# Patient Record
Sex: Male | Born: 1960
Health system: Southern US, Community
[De-identification: ages and names within clinical notes are randomized; demographics above are authoritative.]

## PROBLEM LIST (undated history)

## (undated) DIAGNOSIS — T4145XA Adverse effect of unspecified anesthetic, initial encounter: Secondary | ICD-10-CM

## (undated) DIAGNOSIS — Z87442 Personal history of urinary calculi: Secondary | ICD-10-CM

## (undated) DIAGNOSIS — M109 Gout, unspecified: Secondary | ICD-10-CM

## (undated) DIAGNOSIS — Z9889 Other specified postprocedural states: Secondary | ICD-10-CM

## (undated) DIAGNOSIS — K219 Gastro-esophageal reflux disease without esophagitis: Secondary | ICD-10-CM

## (undated) DIAGNOSIS — N529 Male erectile dysfunction, unspecified: Secondary | ICD-10-CM

## (undated) DIAGNOSIS — F329 Major depressive disorder, single episode, unspecified: Secondary | ICD-10-CM

## (undated) DIAGNOSIS — T8859XA Other complications of anesthesia, initial encounter: Secondary | ICD-10-CM

## (undated) DIAGNOSIS — I10 Essential (primary) hypertension: Secondary | ICD-10-CM

## (undated) DIAGNOSIS — R112 Nausea with vomiting, unspecified: Secondary | ICD-10-CM

## (undated) DIAGNOSIS — G4700 Insomnia, unspecified: Secondary | ICD-10-CM

## (undated) DIAGNOSIS — Z8489 Family history of other specified conditions: Secondary | ICD-10-CM

## (undated) DIAGNOSIS — R7303 Prediabetes: Secondary | ICD-10-CM

## (undated) DIAGNOSIS — C679 Malignant neoplasm of bladder, unspecified: Secondary | ICD-10-CM

## (undated) DIAGNOSIS — R972 Elevated prostate specific antigen [PSA]: Secondary | ICD-10-CM

## (undated) DIAGNOSIS — N400 Enlarged prostate without lower urinary tract symptoms: Secondary | ICD-10-CM

## (undated) DIAGNOSIS — F32A Depression, unspecified: Secondary | ICD-10-CM

## (undated) HISTORY — PX: VASECTOMY: SHX75

## (undated) HISTORY — PX: KNEE ARTHROSCOPY: SHX127

## (undated) HISTORY — PX: KIDNEY STONE SURGERY: SHX686

## (undated) HISTORY — PX: NASAL SINUS SURGERY: SHX719

## (undated) HISTORY — PX: COLONOSCOPY: SHX174

## (undated) HISTORY — PX: TONSILLECTOMY: SUR1361

---

## 2009-05-04 ENCOUNTER — Ambulatory Visit: Payer: Self-pay | Admitting: Unknown Physician Specialty

## 2009-05-11 ENCOUNTER — Ambulatory Visit: Payer: Self-pay | Admitting: Unknown Physician Specialty

## 2012-04-30 ENCOUNTER — Ambulatory Visit: Payer: Self-pay | Admitting: Unknown Physician Specialty

## 2012-12-16 ENCOUNTER — Ambulatory Visit: Payer: Self-pay | Admitting: Gastroenterology

## 2012-12-17 LAB — PATHOLOGY REPORT

## 2016-12-19 DIAGNOSIS — M7662 Achilles tendinitis, left leg: Secondary | ICD-10-CM | POA: Diagnosis not present

## 2017-01-08 DIAGNOSIS — I10 Essential (primary) hypertension: Secondary | ICD-10-CM | POA: Diagnosis not present

## 2017-01-08 DIAGNOSIS — Z1159 Encounter for screening for other viral diseases: Secondary | ICD-10-CM | POA: Diagnosis not present

## 2017-01-08 DIAGNOSIS — Z0000 Encounter for general adult medical examination without abnormal findings: Secondary | ICD-10-CM | POA: Diagnosis not present

## 2017-01-08 DIAGNOSIS — K219 Gastro-esophageal reflux disease without esophagitis: Secondary | ICD-10-CM | POA: Diagnosis not present

## 2017-04-13 DIAGNOSIS — R972 Elevated prostate specific antigen [PSA]: Secondary | ICD-10-CM | POA: Diagnosis not present

## 2017-05-31 DIAGNOSIS — L509 Urticaria, unspecified: Secondary | ICD-10-CM | POA: Diagnosis not present

## 2017-05-31 DIAGNOSIS — Z23 Encounter for immunization: Secondary | ICD-10-CM | POA: Diagnosis not present

## 2017-05-31 DIAGNOSIS — M109 Gout, unspecified: Secondary | ICD-10-CM | POA: Diagnosis not present

## 2017-05-31 DIAGNOSIS — L299 Pruritus, unspecified: Secondary | ICD-10-CM | POA: Diagnosis not present

## 2017-08-03 DIAGNOSIS — J029 Acute pharyngitis, unspecified: Secondary | ICD-10-CM | POA: Diagnosis not present

## 2017-08-06 ENCOUNTER — Other Ambulatory Visit: Payer: Self-pay | Admitting: Gastroenterology

## 2017-08-06 DIAGNOSIS — Z86010 Personal history of colonic polyps: Secondary | ICD-10-CM | POA: Diagnosis not present

## 2017-08-06 DIAGNOSIS — R945 Abnormal results of liver function studies: Principal | ICD-10-CM

## 2017-08-06 DIAGNOSIS — R7989 Other specified abnormal findings of blood chemistry: Secondary | ICD-10-CM

## 2017-08-13 ENCOUNTER — Ambulatory Visit
Admission: RE | Admit: 2017-08-13 | Discharge: 2017-08-13 | Disposition: A | Discharge status: Home / Self Care | Payer: 59 | Source: Ambulatory Visit | Attending: Gastroenterology | Admitting: Gastroenterology

## 2017-08-13 DIAGNOSIS — R7989 Other specified abnormal findings of blood chemistry: Secondary | ICD-10-CM | POA: Insufficient documentation

## 2017-08-13 DIAGNOSIS — R945 Abnormal results of liver function studies: Secondary | ICD-10-CM | POA: Diagnosis present

## 2017-08-13 DIAGNOSIS — K760 Fatty (change of) liver, not elsewhere classified: Secondary | ICD-10-CM | POA: Diagnosis not present

## 2017-08-13 DIAGNOSIS — N2889 Other specified disorders of kidney and ureter: Secondary | ICD-10-CM | POA: Insufficient documentation

## 2017-08-24 ENCOUNTER — Other Ambulatory Visit: Payer: Self-pay | Admitting: Gastroenterology

## 2017-08-24 DIAGNOSIS — N289 Disorder of kidney and ureter, unspecified: Secondary | ICD-10-CM

## 2017-08-30 ENCOUNTER — Other Ambulatory Visit
Admission: RE | Admit: 2017-08-30 | Discharge: 2017-08-30 | Disposition: A | Discharge status: Home / Self Care | Payer: 59 | Source: Ambulatory Visit | Attending: Gastroenterology | Admitting: Gastroenterology

## 2017-08-30 ENCOUNTER — Ambulatory Visit
Admission: RE | Admit: 2017-08-30 | Discharge: 2017-08-30 | Disposition: A | Discharge status: Home / Self Care | Payer: 59 | Source: Ambulatory Visit | Attending: Gastroenterology | Admitting: Gastroenterology

## 2017-08-30 DIAGNOSIS — R161 Splenomegaly, not elsewhere classified: Secondary | ICD-10-CM | POA: Diagnosis not present

## 2017-08-30 DIAGNOSIS — N289 Disorder of kidney and ureter, unspecified: Secondary | ICD-10-CM | POA: Insufficient documentation

## 2017-08-30 DIAGNOSIS — K760 Fatty (change of) liver, not elsewhere classified: Secondary | ICD-10-CM | POA: Diagnosis not present

## 2017-08-30 DIAGNOSIS — N281 Cyst of kidney, acquired: Secondary | ICD-10-CM | POA: Insufficient documentation

## 2017-08-30 LAB — BASIC METABOLIC PANEL
Anion gap: 10 (ref 5–15)
BUN: 20 mg/dL (ref 6–20)
CALCIUM: 9.4 mg/dL (ref 8.9–10.3)
CO2: 28 mmol/L (ref 22–32)
CREATININE: 1.27 mg/dL — AB (ref 0.61–1.24)
Chloride: 99 mmol/L — ABNORMAL LOW (ref 101–111)
GFR calc Af Amer: >60 mL/min (ref >60)
GFR calc non Af Amer: >60 mL/min (ref >60)
Glucose, Bld: 107 mg/dL — ABNORMAL HIGH (ref 65–99)
Potassium: 4.3 mmol/L (ref 3.5–5.1)
Sodium: 137 mmol/L (ref 135–145)

## 2017-08-30 MED ORDER — GADOBENATE DIMEGLUMINE 529 MG/ML IV SOLN
20.0000 mL | Freq: Once | INTRAVENOUS | Status: AC | PRN
  Administered 2017-08-30: 20 mL via INTRAVENOUS

## 2017-09-12 DIAGNOSIS — R945 Abnormal results of liver function studies: Secondary | ICD-10-CM | POA: Diagnosis not present

## 2017-10-11 ENCOUNTER — Encounter: Payer: Self-pay | Admitting: *Deleted

## 2017-10-12 ENCOUNTER — Ambulatory Visit
Admission: RE | Admit: 2017-10-12 | Discharge: 2017-10-12 | Disposition: A | Discharge status: Home / Self Care | Payer: 59 | Source: Ambulatory Visit | Attending: Gastroenterology | Admitting: Gastroenterology

## 2017-10-12 ENCOUNTER — Other Ambulatory Visit: Payer: Self-pay

## 2017-10-12 ENCOUNTER — Ambulatory Visit: Payer: 59 | Admitting: Certified Registered Nurse Anesthetist

## 2017-10-12 ENCOUNTER — Encounter: Payer: Self-pay | Admitting: Certified Registered Nurse Anesthetist

## 2017-10-12 ENCOUNTER — Encounter
Admission: RE | Disposition: A | Discharge status: Home / Self Care | Payer: Self-pay | Source: Ambulatory Visit | Attending: Gastroenterology

## 2017-10-12 DIAGNOSIS — D123 Benign neoplasm of transverse colon: Secondary | ICD-10-CM | POA: Diagnosis not present

## 2017-10-12 DIAGNOSIS — I10 Essential (primary) hypertension: Secondary | ICD-10-CM | POA: Insufficient documentation

## 2017-10-12 DIAGNOSIS — D124 Benign neoplasm of descending colon: Secondary | ICD-10-CM | POA: Diagnosis not present

## 2017-10-12 DIAGNOSIS — Z86010 Personal history of colonic polyps: Secondary | ICD-10-CM | POA: Diagnosis not present

## 2017-10-12 DIAGNOSIS — N529 Male erectile dysfunction, unspecified: Secondary | ICD-10-CM | POA: Insufficient documentation

## 2017-10-12 DIAGNOSIS — K5790 Diverticulosis of intestine, part unspecified, without perforation or abscess without bleeding: Secondary | ICD-10-CM | POA: Diagnosis not present

## 2017-10-12 DIAGNOSIS — Z79899 Other long term (current) drug therapy: Secondary | ICD-10-CM | POA: Diagnosis not present

## 2017-10-12 DIAGNOSIS — F329 Major depressive disorder, single episode, unspecified: Secondary | ICD-10-CM | POA: Diagnosis not present

## 2017-10-12 DIAGNOSIS — D128 Benign neoplasm of rectum: Secondary | ICD-10-CM | POA: Insufficient documentation

## 2017-10-12 DIAGNOSIS — Z885 Allergy status to narcotic agent status: Secondary | ICD-10-CM | POA: Diagnosis not present

## 2017-10-12 DIAGNOSIS — K219 Gastro-esophageal reflux disease without esophagitis: Secondary | ICD-10-CM | POA: Diagnosis not present

## 2017-10-12 DIAGNOSIS — Z1211 Encounter for screening for malignant neoplasm of colon: Secondary | ICD-10-CM | POA: Diagnosis not present

## 2017-10-12 DIAGNOSIS — K635 Polyp of colon: Secondary | ICD-10-CM | POA: Diagnosis not present

## 2017-10-12 DIAGNOSIS — G4700 Insomnia, unspecified: Secondary | ICD-10-CM | POA: Diagnosis not present

## 2017-10-12 DIAGNOSIS — K5730 Diverticulosis of large intestine without perforation or abscess without bleeding: Secondary | ICD-10-CM | POA: Diagnosis not present

## 2017-10-12 HISTORY — DX: Other specified postprocedural states: Z98.890

## 2017-10-12 HISTORY — DX: Gout, unspecified: M10.9

## 2017-10-12 HISTORY — DX: Insomnia, unspecified: G47.00

## 2017-10-12 HISTORY — DX: Family history of other specified conditions: Z84.89

## 2017-10-12 HISTORY — DX: Major depressive disorder, single episode, unspecified: F32.9

## 2017-10-12 HISTORY — DX: Essential (primary) hypertension: I10

## 2017-10-12 HISTORY — DX: Personal history of urinary calculi: Z87.442

## 2017-10-12 HISTORY — DX: Depression, unspecified: F32.A

## 2017-10-12 HISTORY — DX: Other complications of anesthesia, initial encounter: T88.59XA

## 2017-10-12 HISTORY — PX: COLONOSCOPY WITH PROPOFOL: SHX5780

## 2017-10-12 HISTORY — DX: Nausea with vomiting, unspecified: R11.2

## 2017-10-12 HISTORY — DX: Gastro-esophageal reflux disease without esophagitis: K21.9

## 2017-10-12 HISTORY — DX: Adverse effect of unspecified anesthetic, initial encounter: T41.45XA

## 2017-10-12 SURGERY — COLONOSCOPY WITH PROPOFOL
Anesthesia: General

## 2017-10-12 MED ORDER — PROPOFOL 500 MG/50ML IV EMUL
INTRAVENOUS | Status: DC | PRN
  Administered 2017-10-12: 140 ug/kg/min via INTRAVENOUS

## 2017-10-12 MED ORDER — LIDOCAINE HCL (CARDIAC) PF 100 MG/5ML IV SOSY
PREFILLED_SYRINGE | INTRAVENOUS | Status: DC | PRN
  Administered 2017-10-12: 50 mg via INTRAVENOUS

## 2017-10-12 MED ORDER — PHENYLEPHRINE HCL 10 MG/ML IJ SOLN
INTRAMUSCULAR | Status: AC
  Filled 2017-10-12: qty 1

## 2017-10-12 MED ORDER — PHENYLEPHRINE HCL 10 MG/ML IJ SOLN
INTRAMUSCULAR | Status: DC | PRN
  Administered 2017-10-12: 100 ug via INTRAVENOUS

## 2017-10-12 MED ORDER — SODIUM CHLORIDE 0.9 % IV SOLN
INTRAVENOUS | Status: DC
  Administered 2017-10-12: 09:00:00 via INTRAVENOUS

## 2017-10-12 MED ORDER — PROPOFOL 10 MG/ML IV BOLUS
INTRAVENOUS | Status: DC | PRN
  Administered 2017-10-12: 100 mg via INTRAVENOUS

## 2017-10-12 MED ORDER — MIDAZOLAM HCL 2 MG/2ML IJ SOLN
INTRAMUSCULAR | Status: DC | PRN
  Administered 2017-10-12: 2 mg via INTRAVENOUS

## 2017-10-12 MED ORDER — PROPOFOL 500 MG/50ML IV EMUL
INTRAVENOUS | Status: AC
  Filled 2017-10-12: qty 50

## 2017-10-12 MED ORDER — LIDOCAINE HCL (PF) 2 % IJ SOLN
INTRAMUSCULAR | Status: AC
  Filled 2017-10-12: qty 10

## 2017-10-12 MED ORDER — MIDAZOLAM HCL 2 MG/2ML IJ SOLN
INTRAMUSCULAR | Status: AC
  Filled 2017-10-12: qty 2

## 2017-10-12 NOTE — Anesthesia Preprocedure Evaluation (Signed)
Anesthesia Evaluation  Patient identified by MRN, date of birth, ID band Patient awake    Reviewed: Allergy & Precautions, H&P , NPO status , Patient's Chart, lab work & pertinent test results, reviewed documented beta blocker date and time   History of Anesthesia Complications (+) PONV, Family history of anesthesia reaction and history of anesthetic complications  Airway Mallampati: II   Neck ROM: full    Dental  (+) Teeth Intact   Pulmonary neg pulmonary ROS,    Pulmonary exam normal        Cardiovascular Exercise Tolerance: Good hypertension, On Medications negative cardio ROS Normal cardiovascular exam Rhythm:regular Rate:Normal     Neuro/Psych PSYCHIATRIC DISORDERS Depression negative neurological ROS  negative psych ROS   GI/Hepatic negative GI ROS, Neg liver ROS, GERD  ,  Endo/Other  negative endocrine ROS  Renal/GU negative Renal ROS  negative genitourinary   Musculoskeletal   Abdominal   Peds  Hematology negative hematology ROS (+)   Anesthesia Other Findings Past Medical History: No date: Complication of anesthesia No date: Depression No date: Family history of adverse reaction to anesthesia     Comment:  mom nausea No date: GERD (gastroesophageal reflux disease) No date: Gout No date: History of kidney stones No date: Hypertension No date: Insomnia No date: PONV (postoperative nausea and vomiting)     Comment:  nausea Past Surgical History: No date: COLONOSCOPY No date: KIDNEY STONE SURGERY No date: TONSILLECTOMY No date: VASECTOMY BMI    Body Mass Index:  28.41 kg/m     Reproductive/Obstetrics negative OB ROS                             Anesthesia Physical Anesthesia Plan  ASA: III  Anesthesia Plan: General   Post-op Pain Management:    Induction:   PONV Risk Score and Plan:   Airway Management Planned:   Additional Equipment:   Intra-op Plan:    Post-operative Plan:   Informed Consent: I have reviewed the patients History and Physical, chart, labs and discussed the procedure including the risks, benefits and alternatives for the proposed anesthesia with the patient or authorized representative who has indicated his/her understanding and acceptance.   Dental Advisory Given  Plan Discussed with: CRNA  Anesthesia Plan Comments:         Anesthesia Quick Evaluation

## 2017-10-12 NOTE — Op Note (Signed)
[facility] Gastroenterology Patient Name: Manuel Bruce Procedure Date: 10/12/2017 8:34 AM MRN: [id number] Account #: [id number] Date of Birth: [birth year] Admit Type: Outpatient Age: 57 Room: [facility] ENDO ROOM 1 Gender: Male Note Status: Finalized Procedure:            Colonoscopy Indications:          Personal history of colonic polyps Providers:            Lollie Sails, MD Referring MD:         Irven Easterly. Kary Kos, MD (Referring MD) Medicines:            Monitored Anesthesia Care Complications:        No immediate complications. Procedure:            Pre-Anesthesia Assessment:                       - ASA Grade Assessment: III - A patient with severe                        systemic disease.                       After obtaining informed consent, the colonoscope was                        passed under direct vision. Throughout the procedure,                        the patient's blood pressure, pulse, and oxygen                        saturations were monitored continuously. The                        Colonoscope was introduced through the anus and                        advanced to the the cecum, identified by appendiceal                        orifice and ileocecal valve. The colonoscopy was                        performed without difficulty. The patient tolerated the                        procedure well. The quality of the bowel preparation                        was good. Findings:      Two sessile polyps were found in the transverse colon. The polyps were 3       to 4 mm in size. These polyps were removed with a cold snare. Resection       and retrieval were complete.      Two sessile polyps were found in the transverse colon. The polyps were 3       to 6 mm in size. These polyps were removed with a cold snare. Resection       and retrieval were complete.      A 2 mm polyp was found in the transverse  colon. The polyp was sessile.       The polyp was  removed with a cold biopsy forceps. Resection and       retrieval were complete.      A 4 mm polyp was found in the descending colon. The polyp was sessile.       The polyp was removed with a cold snare. Resection and retrieval were       complete.      Two sessile polyps were found in the rectum. The polyps were 1 to 2 mm       in size. These polyps were removed with a cold biopsy forceps. Resection       and retrieval were complete.      A few small-mouthed diverticula were found in the sigmoid colon,       descending colon and transverse colon.      The retroflexed view of the distal rectum and anal verge was normal and       showed no anal or rectal abnormalities. Impression:           - Two 3 to 4 mm polyps in the transverse colon, removed                        with a cold snare. Resected and retrieved.                       - Two 3 to 6 mm polyps in the transverse colon, removed                        with a cold snare. Resected and retrieved.                       - One 2 mm polyp in the transverse colon, removed with                        a cold biopsy forceps. Resected and retrieved.                       - One 4 mm polyp in the descending colon, removed with                        a cold snare. Resected and retrieved.                       - Two 1 to 2 mm polyps in the rectum, removed with a                        cold biopsy forceps. Resected and retrieved.                       - Diverticulosis in the sigmoid colon, in the                        descending colon and in the transverse colon.                       - The distal rectum and anal verge are normal on  retroflexion view. Recommendation:       - Await pathology results.                       - Telephone GI clinic for pathology results in 1 week.                       - Soft diet today, then advance as tolerated to advance                        diet as tolerated. Procedure Code(s):    ---  Professional ---                       [phone], Colonoscopy, flexible; with removal of tumor(s),                        polyp(s), or other lesion(s) by snare technique                       [id number], 76, Colonoscopy, flexible; with biopsy, single                        or multiple Diagnosis Code(s):    --- Professional ---                       D12.3, Benign neoplasm of transverse colon (hepatic                        flexure or splenic flexure)                       K62.1, Rectal polyp                       D12.4, Benign neoplasm of descending colon                       Z86.010, Personal history of colonic polyps                       K57.30, Diverticulosis of large intestine without                        perforation or abscess without bleeding CPT copyright 2017 American Medical Association. All rights reserved. The codes documented in this report are preliminary and upon coder review may  be revised to meet current compliance requirements. Lollie Sails, MD 10/12/2017 9:06:59 AM This report has been signed electronically. Number of Addenda: 0 Note Initiated On: 10/12/2017 8:34 AM Scope Withdrawal Time: 0 hours 19 minutes 48 seconds  Total Procedure Duration: 0 hours 24 minutes 31 seconds       [facility]

## 2017-10-12 NOTE — Anesthesia Post-op Follow-up Note (Signed)
Anesthesia QCDR form completed.        

## 2017-10-12 NOTE — Anesthesia Postprocedure Evaluation (Signed)
Anesthesia Post Note  Patient: Manuel Bruce  Procedure(s) Performed: COLONOSCOPY WITH PROPOFOL (N/A )  Patient location during evaluation: PACU Anesthesia Type: General Level of consciousness: awake and alert Pain management: pain level controlled Vital Signs Assessment: post-procedure vital signs reviewed and stable Respiratory status: spontaneous breathing, nonlabored ventilation, respiratory function stable and patient connected to nasal cannula oxygen Cardiovascular status: blood pressure returned to baseline and stable Postop Assessment: no apparent nausea or vomiting Anesthetic complications: no     Last Vitals:  Vitals:   10/12/17 0917 10/12/17 0937  BP: (!) 105/58 116/64  Pulse:    Resp:    Temp:    SpO2:      Last Pain:  Vitals:   10/12/17 0927  TempSrc:   PainSc: 0-No pain                 Molli Barrows

## 2017-10-12 NOTE — H&P (Signed)
Outpatient short stay form Pre-procedure 10/12/2017 8:12 AM Manuel Sails MD  Primary Physician: Dr. Maryland Bruce  Reason for visit: Colonoscopy  History of present illness: Patient is a 57 year old male presenting today as above.  He has a personal history of adenomatous colon polyps.  Takes no aspirin or blood thinning agent.  He tolerated his prep well.    Current Facility-Administered Medications:  .  0.9 %  sodium chloride infusion, , Intravenous, Continuous, Manuel Sails, MD  Medications Prior to Admission  Medication Sig Dispense Refill Last Dose  . aluminum chloride (DRYSOL) 20 % external solution Apply 20 % topically at bedtime.     [name] buPROPion (WELLBUTRIN XL) 150 MG 24 hr tablet Take 150 mg by mouth daily.   10/11/2017 at Unknown time  . diphenhydrAMINE (BENADRYL) 50 MG capsule Take 50 mg by mouth every 6 (six) hours as needed.     . fluticasone (FLONASE) 50 MCG/ACT nasal spray Place 2 sprays into both nostrils daily.     [name] lisinopril (PRINIVIL,ZESTRIL) 40 MG tablet Take 40 mg by mouth daily.   10/12/2017 at 0600  . omeprazole (PRILOSEC) 40 MG capsule Take 40 mg by mouth daily.   10/11/2017 at Unknown time  . tadalafil (CIALIS) 20 MG tablet Take 20 mg by mouth daily as needed for erectile dysfunction.     [name] zolpidem (AMBIEN) 10 MG tablet Take 10 mg by mouth at bedtime as needed for sleep.   10/11/2017 at Unknown time     Allergies  Allergen Reactions  . Codeine Nausea And Vomiting     Past Medical History:  Diagnosis Date  . Complication of anesthesia   . Depression   . Family history of adverse reaction to anesthesia    mom nausea  . GERD (gastroesophageal reflux disease)   . Gout   . History of kidney stones   . Hypertension   . Insomnia   . PONV (postoperative nausea and vomiting)    nausea    Review of systems:      Physical Exam    Heart and lungs: Regular rate and rhythm without rub or gallop, lungs are bilaterally clear.    HEENT:  Normocephalic atraumatic eyes are anicteric    Other:    Pertinant exam for procedure: Soft nontender nondistended bowel sounds positive normoactive    Planned proceedures: Colonoscopy and indicated procedures. I have discussed the risks benefits and complications of procedures to include not limited to bleeding, infection, perforation and the risk of sedation and the patient wishes to proceed.    Manuel Sails, MD Gastroenterology 10/12/2017  8:12 AM

## 2017-10-12 NOTE — Transfer of Care (Signed)
Immediate Anesthesia Transfer of Care Note  Patient: KENDALE REMBOLD  Procedure(s) Performed: COLONOSCOPY WITH PROPOFOL (N/A )  Patient Location: PACU and Endoscopy Unit  Anesthesia Type:General  Level of Consciousness: drowsy  Airway & Oxygen Therapy: Patient Spontanous Breathing and Patient connected to nasal cannula oxygen  Post-op Assessment: Report given to RN and Post -op Vital signs reviewed and stable  Post vital signs: Reviewed and stable  Last Vitals:  Vitals Value Taken Time  BP 91/47 10/12/2017  9:07 AM  Temp 36.2 C 10/12/2017  9:07 AM  Pulse 68 10/12/2017  9:08 AM  Resp 17 10/12/2017  9:08 AM  SpO2 97 % 10/12/2017  9:08 AM  Vitals shown include unvalidated device data.  Last Pain:  Vitals:   10/12/17 0907  TempSrc: Tympanic  PainSc: Asleep         Complications: No apparent anesthesia complications

## 2017-10-15 ENCOUNTER — Encounter: Payer: Self-pay | Admitting: Gastroenterology

## 2017-10-15 LAB — SURGICAL PATHOLOGY

## 2017-12-06 DIAGNOSIS — L918 Other hypertrophic disorders of the skin: Secondary | ICD-10-CM | POA: Diagnosis not present

## 2017-12-06 DIAGNOSIS — L820 Inflamed seborrheic keratosis: Secondary | ICD-10-CM | POA: Diagnosis not present

## 2017-12-06 DIAGNOSIS — Z1283 Encounter for screening for malignant neoplasm of skin: Secondary | ICD-10-CM | POA: Diagnosis not present

## 2017-12-06 DIAGNOSIS — I8310 Varicose veins of unspecified lower extremity with inflammation: Secondary | ICD-10-CM | POA: Diagnosis not present

## 2017-12-06 DIAGNOSIS — L570 Actinic keratosis: Secondary | ICD-10-CM | POA: Diagnosis not present

## 2017-12-06 DIAGNOSIS — D485 Neoplasm of uncertain behavior of skin: Secondary | ICD-10-CM | POA: Diagnosis not present

## 2017-12-06 DIAGNOSIS — D229 Melanocytic nevi, unspecified: Secondary | ICD-10-CM | POA: Diagnosis not present

## 2018-01-09 DIAGNOSIS — L570 Actinic keratosis: Secondary | ICD-10-CM | POA: Diagnosis not present

## 2018-01-09 DIAGNOSIS — S80812S Abrasion, left lower leg, sequela: Secondary | ICD-10-CM | POA: Diagnosis not present

## 2018-01-09 DIAGNOSIS — L820 Inflamed seborrheic keratosis: Secondary | ICD-10-CM | POA: Diagnosis not present

## 2018-01-09 DIAGNOSIS — L308 Other specified dermatitis: Secondary | ICD-10-CM | POA: Diagnosis not present

## 2018-01-30 DIAGNOSIS — L820 Inflamed seborrheic keratosis: Secondary | ICD-10-CM | POA: Diagnosis not present

## 2018-01-30 DIAGNOSIS — L738 Other specified follicular disorders: Secondary | ICD-10-CM | POA: Diagnosis not present

## 2018-01-30 DIAGNOSIS — L308 Other specified dermatitis: Secondary | ICD-10-CM | POA: Diagnosis not present

## 2018-01-30 DIAGNOSIS — L570 Actinic keratosis: Secondary | ICD-10-CM | POA: Diagnosis not present

## 2018-03-18 DIAGNOSIS — I10 Essential (primary) hypertension: Secondary | ICD-10-CM | POA: Diagnosis not present

## 2018-03-18 DIAGNOSIS — Z0000 Encounter for general adult medical examination without abnormal findings: Secondary | ICD-10-CM | POA: Diagnosis not present

## 2018-03-18 DIAGNOSIS — Z23 Encounter for immunization: Secondary | ICD-10-CM | POA: Diagnosis not present

## 2018-03-29 DIAGNOSIS — R319 Hematuria, unspecified: Secondary | ICD-10-CM | POA: Diagnosis not present

## 2018-04-18 DIAGNOSIS — L739 Follicular disorder, unspecified: Secondary | ICD-10-CM | POA: Diagnosis not present

## 2018-04-18 DIAGNOSIS — L570 Actinic keratosis: Secondary | ICD-10-CM | POA: Diagnosis not present

## 2018-04-18 DIAGNOSIS — L821 Other seborrheic keratosis: Secondary | ICD-10-CM | POA: Diagnosis not present

## 2018-04-18 DIAGNOSIS — L281 Prurigo nodularis: Secondary | ICD-10-CM | POA: Diagnosis not present

## 2018-05-03 DIAGNOSIS — M109 Gout, unspecified: Secondary | ICD-10-CM | POA: Diagnosis not present

## 2018-05-29 IMAGING — MR MR ABDOMEN WO/W CM
16 of 17 series · 44 of 48 positions shown · IV contrast (multihance)
Comparison: Renal ultrasound dated 08/13/2017

CLINICAL DATA: Possible renal lesions on ultrasound

EXAM:
MRI ABDOMEN WITHOUT AND WITH CONTRAST
TECHNIQUE: Multiplanar multisequence MR imaging of the abdomen was performed
both before and after the administration of intravenous contrast.
CONTRAST:  20mL MULTIHANCE GADOBENATE DIMEGLUMINE 529 MG/ML IV SOLN

[Series 2: cor ssfse / · coronal · 7.0mm · 1.56mm/px · 2 of 30 slices shown]
[im 1/30]
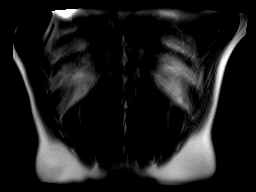
[im 30/30]
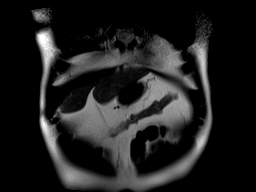

[Series 3: T1 · axial · 6.0mm · 0.74mm/px · z∈[-123,+86]mm · 4 of 60 slices shown]
[im 1/60]
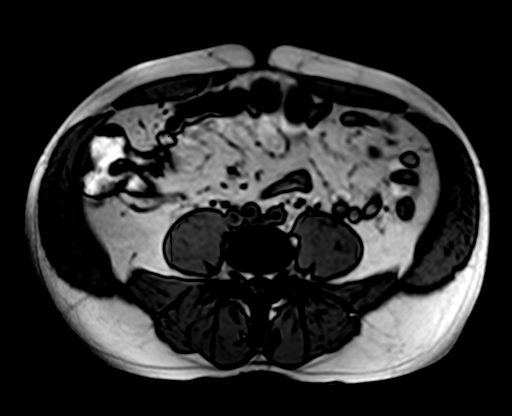
[im 20/60]
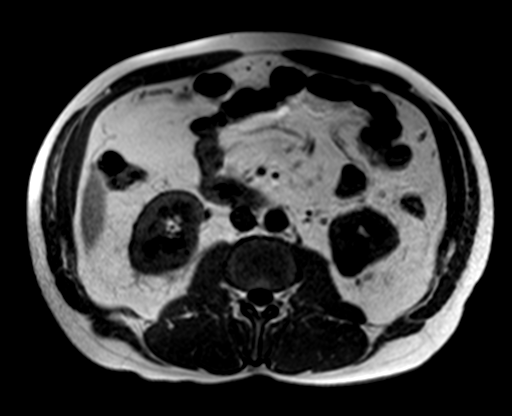
[im 40/60]
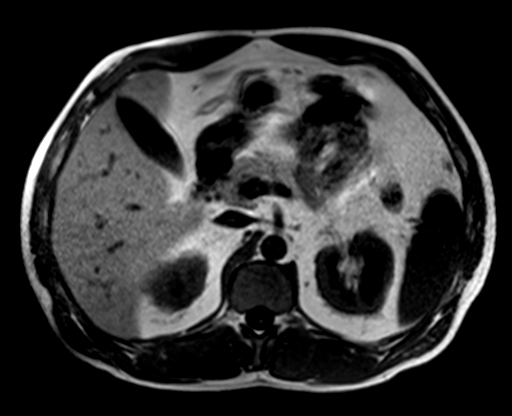
[im 60/60]
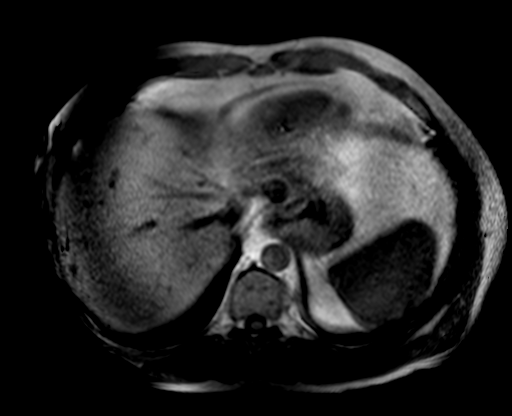

[Series 4: T2 · axial · 6.0mm · 1.48mm/px · z∈[-123,+86]mm · 2 of 30 slices shown]
[im 1/30]
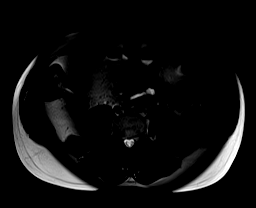
[im 30/30]
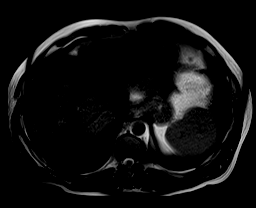

[Series 5: DWI · axial · 6.0mm · 2.00mm/px · z∈[-144,+108]mm · 5 of 107 slices shown (1 of 2)]
[im 1/107]
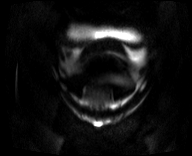
[im 27/107]
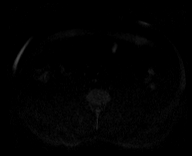
[im 54/107]
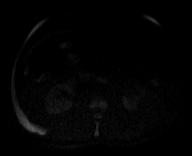
[im 80/107]
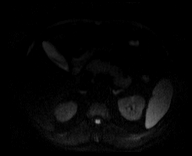
[im 107/107]
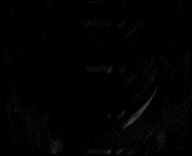

[Series 6: DWI · axial · 6.0mm · 2.00mm/px · z∈[-144,+108]mm · 2 of 36 slices shown (2 of 2)]
[im 1/36]
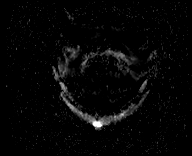
[im 36/36]
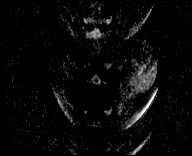

[Series 7: bSSFP · axial · 6.0mm · 0.74mm/px · 1 of 30 slices shown]
[im 1/30]
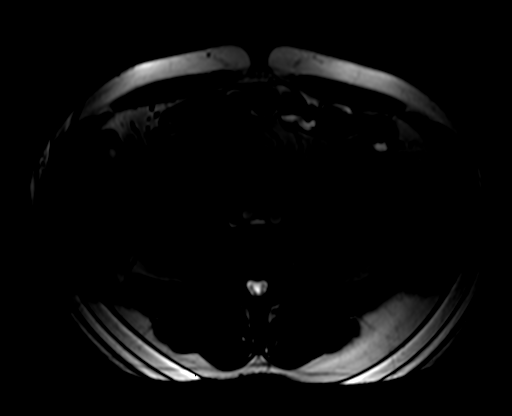

[Series 8: axial dynamic pre · axial · non-contrast · 4.0mm · 1.19mm/px · z∈[-148,+136]mm · 3 of 72 slices shown]
[im 1/72]
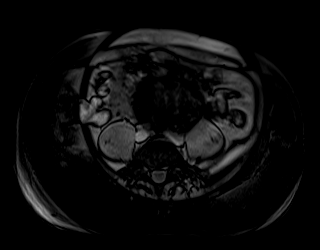
[im 36/72]
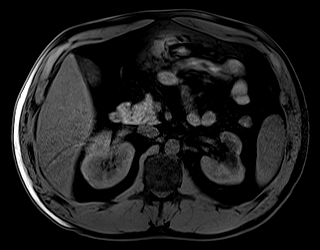
[im 72/72]
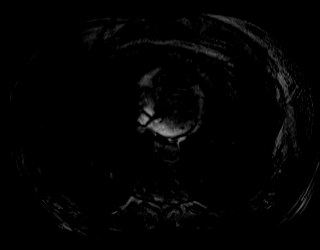

[Series 9: axial dynamic post · axial · 4.0mm · 1.19mm/px · z∈[-148,+136]mm · 3 of 72 slices shown (1 of 6)]
[im 1/72]
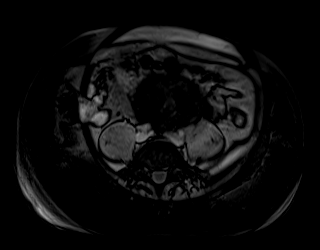
[im 36/72]
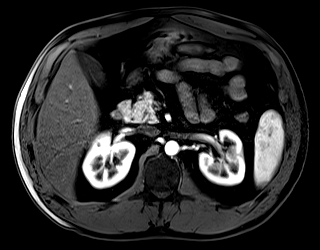
[im 72/72]
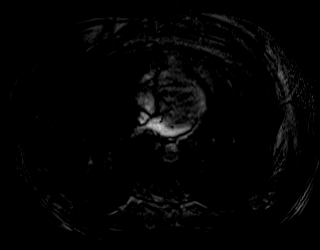

[Series 10: axial dynamic post · axial · 4.0mm · 1.19mm/px · z∈[-148,+136]mm · 3 of 72 slices shown (2 of 6)]
[im 1/72]
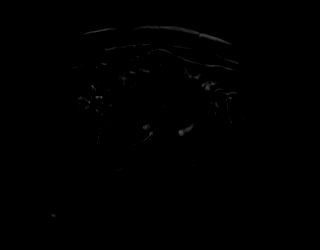
[im 36/72]
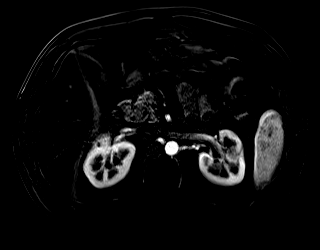
[im 72/72]
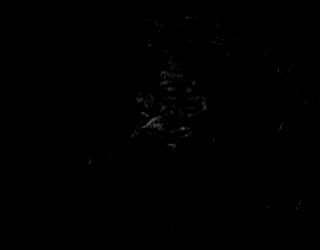

[Series 11: axial dynamic post · axial · 4.0mm · 1.19mm/px · z∈[-148,+136]mm · 3 of 72 slices shown (3 of 6)]
[im 1/72]
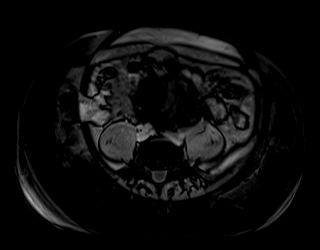
[im 36/72]
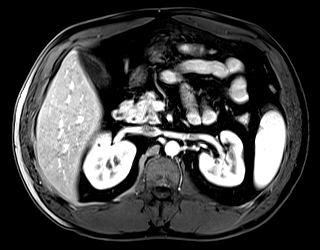
[im 72/72]
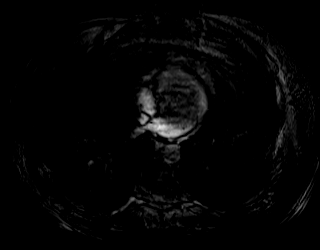

[Series 12: axial dynamic post · axial · 4.0mm · 1.19mm/px · z∈[-148,+136]mm · 3 of 72 slices shown (4 of 6)]
[im 1/72]
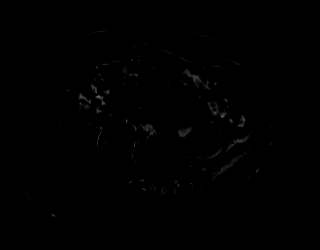
[im 36/72]
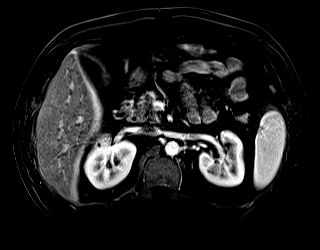
[im 72/72]
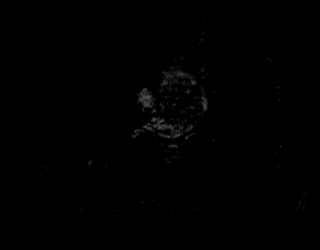

[Series 13: axial dynamic post · axial · 4.0mm · 1.19mm/px · z∈[-148,+136]mm · 3 of 72 slices shown (5 of 6)]
[im 1/72]
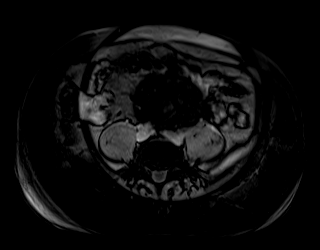
[im 36/72]
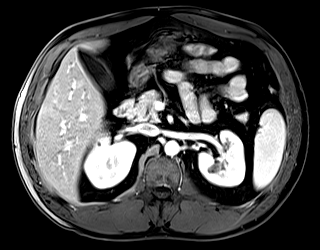
[im 72/72]
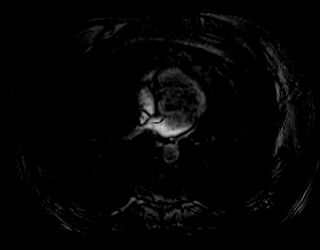

[Series 14: axial dynamic post · axial · 4.0mm · 1.19mm/px · z∈[-148,+136]mm · 3 of 72 slices shown (6 of 6)]
[im 1/72]
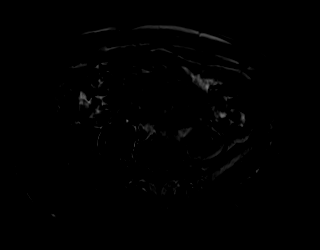
[im 36/72]
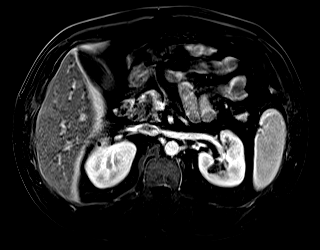
[im 72/72]
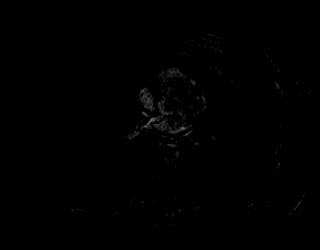

[Series 16: axial dynamic 3 · axial · 4.0mm · 1.19mm/px · z∈[-148,+136]mm · 3 of 72 slices shown (1 of 2)]
[im 1/72]
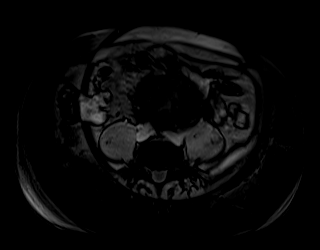
[im 36/72]
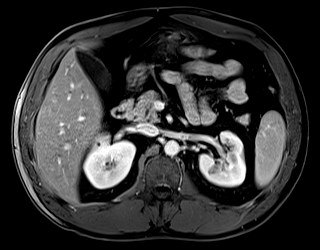
[im 72/72]
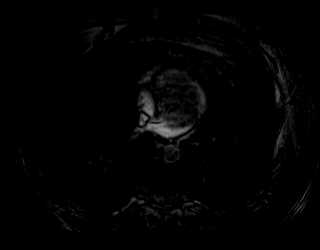

[Series 17: axial dynamic 3 · axial · 4.0mm · 1.19mm/px · z∈[-148,+136]mm · 3 of 72 slices shown (2 of 2)]
[im 1/72]
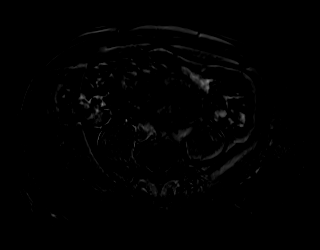
[im 36/72]
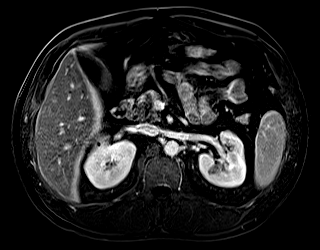
[im 72/72]
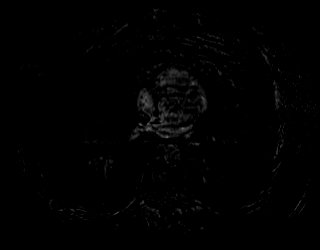

[Series 18: axial ssfse / · axial · 6.0mm · 1.19mm/px · 1 of 30 slices shown]
[im 1/30]
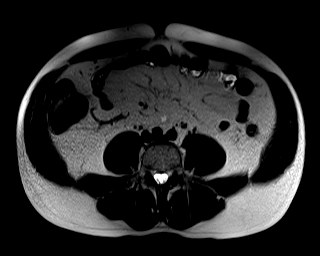

[44 of 48 positions shown; findings below may reference images not displayed]

FINDINGS: Lower chest: Lung bases are clear.

Hepatobiliary: Severe hepatic steatosis. No suspicious/enhancing
hepatic lesions.

Gallbladder is unremarkable. No intrahepatic or extrahepatic ductal
dilatation.

Pancreas:  Within normal limits.

Spleen: Mild splenomegaly, measuring 14.5 cm in craniocaudal
dimension.

Adrenals/Urinary Tract:  Adrenal glands are within normal limits.

Cortical scarring along the lateral interpolar left kidney with
suspected underlying calyceal diverticulum, likely reflecting
sequela of prior infection/inflammation. This appearance corresponds
to the suspected complex cystic lesion on prior ultrasound.

Simple bilateral renal cysts, measuring up to 2.8 cm in the
posterior left lower kidney (series 18/image 21), benign (Bosniak
I). No enhancing renal lesions. Specifically, no solid
mass/differential enhancement in the interpolar right kidney to
correspond to the suspected sonographic abnormality.

No hydronephrosis.

Stomach/Bowel: Stomach is within normal limits.

Visualized bowel is unremarkable.

Vascular/Lymphatic:  No evidence of aneurysm.

No suspicious abdominal lymphadenopathy.

Other:  No abdominal ascites.

Musculoskeletal: No focal osseous lesions.
IMPRESSION: Cortical scarring in the interpolar left kidney with underlying
calyceal diverticulum, likely reflecting sequela of prior
infection/inflammation. This corresponds to the suspected complex
cystic lesion on prior ultrasound.

No solid mass in the interpolar right kidney to correspond to the
other suspected sonographic abnormality. No enhancing renal lesions.

Simple bilateral renal cysts, measuring up to 2.8 cm in the
posterior left lower kidney, benign (Bosniak I).

Severe hepatic steatosis.

Mild splenomegaly.

## 2018-06-03 DIAGNOSIS — M1A3710 Chronic gout due to renal impairment, right ankle and foot, without tophus (tophi): Secondary | ICD-10-CM | POA: Diagnosis not present

## 2018-09-06 DIAGNOSIS — M1A0720 Idiopathic chronic gout, left ankle and foot, without tophus (tophi): Secondary | ICD-10-CM | POA: Diagnosis not present

## 2018-10-02 DIAGNOSIS — M7662 Achilles tendinitis, left leg: Secondary | ICD-10-CM | POA: Diagnosis not present

## 2018-10-02 DIAGNOSIS — M79672 Pain in left foot: Secondary | ICD-10-CM | POA: Diagnosis not present

## 2018-10-02 DIAGNOSIS — M1A0720 Idiopathic chronic gout, left ankle and foot, without tophus (tophi): Secondary | ICD-10-CM | POA: Diagnosis not present

## 2018-10-23 DIAGNOSIS — M1A9XX1 Chronic gout, unspecified, with tophus (tophi): Secondary | ICD-10-CM | POA: Diagnosis not present

## 2022-06-23 ENCOUNTER — Ambulatory Visit: Payer: PRIVATE HEALTH INSURANCE | Admitting: Urology

## 2022-06-23 ENCOUNTER — Encounter: Payer: Self-pay | Admitting: Urology

## 2022-06-23 VITALS — BP 144/88 | HR 69 | Ht 70.0 in | Wt 202.0 lb

## 2022-06-23 DIAGNOSIS — R972 Elevated prostate specific antigen [PSA]: Secondary | ICD-10-CM

## 2022-06-23 LAB — URINALYSIS, COMPLETE
Bilirubin, UA: NEGATIVE
Glucose, UA: NEGATIVE
Ketones, UA: NEGATIVE
Leukocytes,UA: NEGATIVE
Nitrite, UA: NEGATIVE
Protein,UA: NEGATIVE
Specific Gravity, UA: 1.02 (ref 1.005–1.030)
Urobilinogen, Ur: 0.2 mg/dL (ref 0.2–1.0)
pH, UA: 5.5 (ref 5.0–7.5)

## 2022-06-23 LAB — MICROSCOPIC EXAMINATION: Bacteria, UA: NONE SEEN

## 2022-06-23 NOTE — Progress Notes (Signed)
06/23/2022 10:18 AM   Hall Busing [birth year] [id number]  Referring provider: Maryland Pink, MD [address] [facility] [place],  [place] [address]  Chief Complaint  Patient presents with   Elevated PSA    HPI: Manuel Bruce is a 62 y.o. male referred for evaluation of an elevated PSA.  PSA 05/22/2022 elevated at 5.37 Baseline was in the 2 range from 2016-2021 PSA 4.12 May 2021 No bothersome LUTS; Nocturia X 3 No family history of prostate cancer States he was diagnosed with bladder cancer in his 54s and did not have any follow-up.  States this was discovered at the time a kidney stone was being removed  PMH: Past Medical History:  Diagnosis Date   Complication of anesthesia    Depression    Family history of adverse reaction to anesthesia    mom nausea   GERD (gastroesophageal reflux disease)    Gout    History of kidney stones    Hypertension    Insomnia    PONV (postoperative nausea and vomiting)    nausea    Surgical History: Past Surgical History:  Procedure Laterality Date   COLONOSCOPY     COLONOSCOPY WITH PROPOFOL N/A 10/12/2017   Procedure: COLONOSCOPY WITH PROPOFOL;  Surgeon: Lollie Sails, MD;  Location: [facility] ENDOSCOPY;  Service: Endoscopy;  Laterality: N/A;   KIDNEY STONE SURGERY     TONSILLECTOMY     VASECTOMY      Home Medications:  Allergies as of 06/23/2022       Reactions   Codeine Nausea And Vomiting        Medication List        Accurate as of June 23, 2022 10:18 AM. If you have any questions, ask your nurse or doctor.          aluminum chloride 20 % external solution Commonly known as: DRYSOL Apply 20 % topically at bedtime.   buPROPion 150 MG 24 hr tablet Commonly known as: WELLBUTRIN XL Take 150 mg by mouth daily.   diphenhydrAMINE 50 MG capsule Commonly known as: BENADRYL Take 50 mg by mouth every 6 (six) hours as needed.   fluticasone 50 MCG/ACT nasal spray Commonly known as:  FLONASE Place 2 sprays into both nostrils daily.   lisinopril 40 MG tablet Commonly known as: ZESTRIL Take 40 mg by mouth daily.   omeprazole 40 MG capsule Commonly known as: PRILOSEC Take 40 mg by mouth daily.   tadalafil 20 MG tablet Commonly known as: CIALIS Take 20 mg by mouth daily as needed for erectile dysfunction.   zolpidem 10 MG tablet Commonly known as: AMBIEN Take 10 mg by mouth at bedtime as needed for sleep.        Allergies:  Allergies  Allergen Reactions   Codeine Nausea And Vomiting    Family History: Family History  Problem Relation Age of Onset   Heart disease Mother    Heart disease Father     Social History:  reports that he has never smoked. He has never used smokeless tobacco. He reports current alcohol use of about 6.0 - 8.0 standard drinks of alcohol per week. He reports that he does not use drugs.   Physical Exam: BP (!) 144/88   Pulse 69   Ht '5\' 10"'$  (1.778 m)   Wt 202 lb (91.6 kg)   BMI 28.98 kg/m   Constitutional:  Alert and oriented, No acute distress. HEENT: [place] AT Respiratory: Normal respiratory effort, no increased  work of breathing. GU: Prostate 60 g, smooth without nodules Psychiatric: Normal mood and affect.   Assessment & Plan:    1.  Elevated PSA Although PSA is a prostate cancer screening test he was informed that cancer is not the most common cause of an elevated PSA. Other potential causes including BPH and inflammation were discussed. He was informed that the only way to adequately diagnose prostate cancer would be a transrectal ultrasound and biopsy of the prostate. The procedure was discussed including potential risks of bleeding and infection/sepsis. He was also informed that a negative biopsy does not conclusively rule out the possibility that prostate cancer may be present and that continued monitoring is required. The use of newer adjunctive blood tests including 4kScore was discussed. The use of multiparametric  prostate MRI to assess for lesions suspicious for high-grade prostate cancer and aid in targeted biopsy was reviewed. Continued periodic surveillance was also discussed but not recommended. He has elected to schedule prostate MRI.  Order was entered and will call with results.   Abbie Sons, [place] [address], [place], [place] [address] [phone]

## 2022-07-06 ENCOUNTER — Telehealth: Payer: Self-pay

## 2022-07-06 NOTE — Telephone Encounter (Signed)
Error

## 2023-03-20 DIAGNOSIS — S63636A Sprain of interphalangeal joint of right little finger, initial encounter: Secondary | ICD-10-CM | POA: Diagnosis not present

## 2023-04-30 DIAGNOSIS — J019 Acute sinusitis, unspecified: Secondary | ICD-10-CM | POA: Diagnosis not present

## 2023-05-16 DIAGNOSIS — J209 Acute bronchitis, unspecified: Secondary | ICD-10-CM | POA: Diagnosis not present

## 2023-05-16 DIAGNOSIS — J019 Acute sinusitis, unspecified: Secondary | ICD-10-CM | POA: Diagnosis not present

## 2023-05-17 ENCOUNTER — Other Ambulatory Visit: Payer: Self-pay | Admitting: Nurse Practitioner

## 2023-05-17 ENCOUNTER — Ambulatory Visit
Admission: RE | Admit: 2023-05-17 | Discharge: 2023-05-17 | Disposition: A | Discharge status: Home / Self Care | Payer: BC Managed Care – PPO | Source: Ambulatory Visit | Attending: Nurse Practitioner | Admitting: Nurse Practitioner

## 2023-05-17 ENCOUNTER — Encounter: Payer: Self-pay | Admitting: Nurse Practitioner

## 2023-05-17 ENCOUNTER — Ambulatory Visit
Admission: RE | Admit: 2023-05-17 | Discharge: 2023-05-17 | Disposition: A | Discharge status: Home / Self Care | Payer: BC Managed Care – PPO | Attending: Nurse Practitioner | Admitting: Nurse Practitioner

## 2023-05-17 DIAGNOSIS — B9689 Other specified bacterial agents as the cause of diseases classified elsewhere: Secondary | ICD-10-CM

## 2023-05-17 DIAGNOSIS — J40 Bronchitis, not specified as acute or chronic: Secondary | ICD-10-CM | POA: Diagnosis not present

## 2023-05-17 DIAGNOSIS — J208 Acute bronchitis due to other specified organisms: Secondary | ICD-10-CM | POA: Insufficient documentation

## 2023-06-13 DIAGNOSIS — N200 Calculus of kidney: Secondary | ICD-10-CM

## 2023-06-13 HISTORY — DX: Calculus of kidney: N20.0

## 2023-06-21 DIAGNOSIS — D225 Melanocytic nevi of trunk: Secondary | ICD-10-CM | POA: Diagnosis not present

## 2023-06-21 DIAGNOSIS — R58 Hemorrhage, not elsewhere classified: Secondary | ICD-10-CM | POA: Diagnosis not present

## 2023-06-21 DIAGNOSIS — D2272 Melanocytic nevi of left lower limb, including hip: Secondary | ICD-10-CM | POA: Diagnosis not present

## 2023-06-21 DIAGNOSIS — L820 Inflamed seborrheic keratosis: Secondary | ICD-10-CM | POA: Diagnosis not present

## 2023-06-21 DIAGNOSIS — D485 Neoplasm of uncertain behavior of skin: Secondary | ICD-10-CM | POA: Diagnosis not present

## 2023-06-21 DIAGNOSIS — L570 Actinic keratosis: Secondary | ICD-10-CM | POA: Diagnosis not present

## 2023-06-21 DIAGNOSIS — R208 Other disturbances of skin sensation: Secondary | ICD-10-CM | POA: Diagnosis not present

## 2023-06-21 DIAGNOSIS — B078 Other viral warts: Secondary | ICD-10-CM | POA: Diagnosis not present

## 2023-06-21 DIAGNOSIS — R238 Other skin changes: Secondary | ICD-10-CM | POA: Diagnosis not present

## 2023-06-21 DIAGNOSIS — L814 Other melanin hyperpigmentation: Secondary | ICD-10-CM | POA: Diagnosis not present

## 2023-06-21 DIAGNOSIS — L821 Other seborrheic keratosis: Secondary | ICD-10-CM | POA: Diagnosis not present

## 2023-09-11 DIAGNOSIS — J019 Acute sinusitis, unspecified: Secondary | ICD-10-CM | POA: Diagnosis not present

## 2023-09-11 DIAGNOSIS — J209 Acute bronchitis, unspecified: Secondary | ICD-10-CM | POA: Diagnosis not present

## 2023-09-11 DIAGNOSIS — I10 Essential (primary) hypertension: Secondary | ICD-10-CM | POA: Diagnosis not present

## 2023-09-22 DIAGNOSIS — M19042 Primary osteoarthritis, left hand: Secondary | ICD-10-CM | POA: Diagnosis not present

## 2023-09-22 DIAGNOSIS — Z23 Encounter for immunization: Secondary | ICD-10-CM | POA: Diagnosis not present

## 2023-09-22 DIAGNOSIS — I10 Essential (primary) hypertension: Secondary | ICD-10-CM | POA: Diagnosis not present

## 2023-09-22 DIAGNOSIS — S61217A Laceration without foreign body of left little finger without damage to nail, initial encounter: Secondary | ICD-10-CM | POA: Diagnosis not present

## 2023-09-22 DIAGNOSIS — W274XXA Contact with kitchen utensil, initial encounter: Secondary | ICD-10-CM | POA: Diagnosis not present

## 2023-09-22 DIAGNOSIS — S6992XA Unspecified injury of left wrist, hand and finger(s), initial encounter: Secondary | ICD-10-CM | POA: Diagnosis not present

## 2023-10-01 DIAGNOSIS — R1032 Left lower quadrant pain: Secondary | ICD-10-CM | POA: Diagnosis not present

## 2023-10-01 DIAGNOSIS — E119 Type 2 diabetes mellitus without complications: Secondary | ICD-10-CM | POA: Diagnosis not present

## 2023-10-01 DIAGNOSIS — Z4802 Encounter for removal of sutures: Secondary | ICD-10-CM | POA: Diagnosis not present

## 2023-12-21 ENCOUNTER — Other Ambulatory Visit: Payer: Self-pay | Admitting: Family Medicine

## 2023-12-21 DIAGNOSIS — R1032 Left lower quadrant pain: Secondary | ICD-10-CM

## 2023-12-25 ENCOUNTER — Ambulatory Visit
Admission: RE | Admit: 2023-12-25 | Discharge: 2023-12-25 | Disposition: A | Discharge status: Home / Self Care | Source: Ambulatory Visit | Attending: Family Medicine | Admitting: Family Medicine

## 2023-12-25 DIAGNOSIS — R1032 Left lower quadrant pain: Secondary | ICD-10-CM | POA: Diagnosis not present

## 2024-01-18 ENCOUNTER — Ambulatory Visit (INDEPENDENT_AMBULATORY_CARE_PROVIDER_SITE_OTHER): Admitting: Physician Assistant

## 2024-01-18 ENCOUNTER — Other Ambulatory Visit: Payer: Self-pay

## 2024-01-18 ENCOUNTER — Telehealth: Payer: Self-pay

## 2024-01-18 VITALS — BP 142/87 | HR 91 | Ht 70.0 in | Wt 204.0 lb

## 2024-01-18 DIAGNOSIS — R1032 Left lower quadrant pain: Secondary | ICD-10-CM | POA: Diagnosis not present

## 2024-01-18 DIAGNOSIS — Z8551 Personal history of malignant neoplasm of bladder: Secondary | ICD-10-CM

## 2024-01-18 DIAGNOSIS — N200 Calculus of kidney: Secondary | ICD-10-CM

## 2024-01-18 DIAGNOSIS — R3912 Poor urinary stream: Secondary | ICD-10-CM

## 2024-01-18 LAB — URINALYSIS, COMPLETE
Bilirubin, UA: NEGATIVE
Glucose, UA: NEGATIVE
Ketones, UA: NEGATIVE
Nitrite, UA: NEGATIVE
Specific Gravity, UA: 1.025 (ref 1.005–1.030)
Urobilinogen, Ur: 0.2 mg/dL (ref 0.2–1.0)
pH, UA: 6 (ref 5.0–7.5)

## 2024-01-18 LAB — MICROSCOPIC EXAMINATION: RBC, Urine: >30 /HPF — AB (ref 0–2)

## 2024-01-18 MED ORDER — TAMSULOSIN HCL 0.4 MG PO CAPS: 0.4000 mg | ORAL_CAPSULE | Freq: Every day | ORAL | 11 refills | Status: DC

## 2024-01-18 NOTE — Progress Notes (Signed)
 01/18/2024 8:59 AM   Manuel Bruce Apr 22, 1961 [id number]  CC: Chief Complaint  Patient presents with   Groin Pain   Establish Care   HPI: Manuel Bruce is a 63 y.o. male with PMH elevated PSA, remote bladder cancer, and nephrolithiasis who presents today for evaluation of possible acute stone episode.   He was seen by Dr. Twylla on 06/23/2018 for for elevated PSA.  Prostate MRI was recommended, however this was never performed.  Today he reports an approximate 66-month history of LLQ pain that he describes as a dull ache with intermittent stabbing.  He has had increased urinary frequency and weak stream since this started.  He denies fever, chills, nausea, or vomiting.  He has had 3 prior stone episodes, and always needed intervention for them.  He had a CTAP without contrast on 12/25/2023 for evaluation of the symptoms, which showed a large, 2.0 x 1.1 cm nonobstructing left renal stone.  Prostate is moderately enlarged.  With regard to his remote bladder cancer history, he states he never had any follow-up for this.  He thinks that the bladder tumor was removed, but does not recall.  He denies any gross hematuria.  In-office UA today positive for trace protein, 3+ blood, and trace leukocytes; urine microscopy with 6-10 WBCs/HPF, >30 RBCs/HPF, and moderate bacteria.  PMH: Past Medical History:  Diagnosis Date   Complication of anesthesia    Depression    Family history of adverse reaction to anesthesia    mom nausea   GERD (gastroesophageal reflux disease)    Gout    History of kidney stones    Hypertension    Insomnia    PONV (postoperative nausea and vomiting)    nausea    Surgical History: Past Surgical History:  Procedure Laterality Date   COLONOSCOPY     COLONOSCOPY WITH PROPOFOL  N/A 10/12/2017   Procedure: COLONOSCOPY WITH PROPOFOL ;  Surgeon: Manuel Gladis PENNER, MD;  Location: [facility];  Service: Endoscopy;  Laterality: N/A;   KIDNEY STONE SURGERY      TONSILLECTOMY     VASECTOMY      Home Medications:  Allergies as of 01/18/2024       Reactions   Codeine Nausea And Vomiting        Medication List        Accurate as of January 18, 2024  8:59 AM. If you have any questions, ask your nurse or doctor.          allopurinol 100 MG tablet Commonly known as: ZYLOPRIM Take 200 mg by mouth daily.   aluminum chloride 20 % external solution Commonly known as: DRYSOL Apply 20 % topically at bedtime.   buPROPion 150 MG 24 hr tablet Commonly known as: WELLBUTRIN XL Take 150 mg by mouth daily.   diphenhydrAMINE 50 MG capsule Commonly known as: BENADRYL Take 50 mg by mouth every 6 (six) hours as needed.   fluticasone 50 MCG/ACT nasal spray Commonly known as: FLONASE Place 2 sprays into both nostrils daily.   lisinopril 40 MG tablet Commonly known as: ZESTRIL Take 40 mg by mouth daily.   omeprazole 40 MG capsule Commonly known as: PRILOSEC Take 40 mg by mouth daily.   tadalafil 20 MG tablet Commonly known as: CIALIS Take 20 mg by mouth daily as needed for erectile dysfunction.   zolpidem 10 MG tablet Commonly known as: AMBIEN Take 10 mg by mouth at bedtime as needed for sleep.        Allergies:  Allergies  Allergen Reactions   Codeine Nausea And Vomiting    Family History: Family History  Problem Relation Age of Onset   Heart disease Mother    Heart disease Father     Social History:   reports that he has never smoked. He has never used smokeless tobacco. He reports current alcohol use of about 6.0 - 8.0 standard drinks of alcohol per week. He reports that he does not use drugs.  Physical Exam: BP (!) 142/87 (BP Location: Left Arm, Patient Position: Sitting, Cuff Size: Normal)   Pulse 91   Ht 5' 10 (1.778 m)   Wt 204 lb (92.5 kg)   SpO2 94%   BMI 29.27 kg/m   Constitutional:  Alert and oriented, no acute distress, nontoxic appearing HEENT: [place], AT Cardiovascular: No clubbing, cyanosis, or  edema Respiratory: Normal respiratory effort, no increased work of breathing Skin: No rashes, bruises or suspicious lesions Neurologic: Grossly intact, no focal deficits, moving all 4 extremities Psychiatric: Normal mood and affect  Laboratory Data: Results for orders placed or performed in visit on 01/18/24  Microscopic Examination   Collection Time: 01/18/24  8:43 AM   Urine  Result Value Ref Range   WBC, UA 6-10 (A) 0 - 5 /hpf   RBC, Urine >30 (A) 0 - 2 /hpf   Epithelial Cells (non renal) 0-10 0 - 10 /hpf   Casts Present (A) None seen /lpf   Cast Type Granular casts (A) N/A   Bacteria, UA Moderate (A) None seen/Few  Urinalysis, Complete   Collection Time: 01/18/24  8:43 AM  Result Value Ref Range   Specific Gravity, UA 1.025 1.005 - 1.030   pH, UA 6.0 5.0 - 7.5   Color, UA Yellow Yellow   Appearance Ur Clear Clear   Leukocytes,UA Trace (A) Negative   Protein,UA Trace Negative/Trace   Glucose, UA Negative Negative   Ketones, UA Negative Negative   RBC, UA 3+ (A) Negative   Bilirubin, UA Negative Negative   Urobilinogen, Ur 0.2 0.2 - 1.0 mg/dL   Nitrite, UA Negative Negative   Microscopic Examination See below:    Pertinent Imaging: CTAP without contrast, 12/25/2023: EXAM:  CT ABDOMEN PELVIS WITHOUT IV CONTRAST   INDICATION:  Left lower quadrant pain for 3 months.   TECHNIQUE: Spiral CT scanning was performed through the abdomen and pelvis with no contrast.   FINDINGS: There is no significant abnormality identified in the lung bases. There is diffuse hepatic steatosis. The spleen, pancreas and gallbladder are within normal limits. A 2.0 x 1.1 cm calculus is present in the left renal pelvis. There are smaller left intrarenal calculi. No hydronephrosis is present. There is cortical scarring in the mid and lower poles. Bilateral renal cysts are present which do not require follow-up. No ureteral calculi are visualized. There are no adrenal masses. The abdominal bowel  loops are within normal limits. There is no evidence of ascites or adenopathy. No abdominal aortic aneurysm is present.   Moderate enlargement of the prostate is present. The median lobe indents the bladder base. No bladder calculi are present. Sigmoid diverticulosis is present with no associated inflammation.   There is no fracture or bone destruction.   IMPRESSION: 1. Left nephrolithiasis as described above. 2. No evidence of ureteral calculus or obstruction. 3. Left renal cortical scarring, most likely from previous infections. 4. Additional nonacute findings as described above.     Please note that CT scanning at this site utilizes multiple dose reduction techniques, including  automatic exposure control, adjustment of the MAA and/or KVP according to the patient's size, and use of iterative reconstruction.   Electronically signed by: Eddy Oar MD 12/26/2023 02:54 PM EDT RP Workstation: [id number]  I personally reviewed the images referenced above and note an enlarged prostate and a large, nonobstructing left renal stone.  Assessment & Plan:   1. Left renal stone (Primary) Large left renal stone.  I suspect this is the source of his pyuria and microscopic hematuria.  Will send for preop urine culture today.  We discussed that he is unlikely to spontaneously pass the stone and I recommended ureteroscopy for definitive management.  We discussed risks of surgery including infection, bleeding, and damage to surrounding structures.  We discussed that he will require a stent postoperatively.  He is tolerated these reasonably well in the past.  I expect based on stone size that he will keep the stent in place for 7 to 10 days and will require cystoscopy stent removal in clinic.  He expressed understanding and is in agreement with this plan. - Urinalysis, Complete - CULTURE, URINE COMPREHENSIVE - Ambulatory Referral For Surgery Scheduling  2. Weak urinary stream I suspect his recent  frequency and weak stream is more due to his BPH than his stone.  Will start him on Flomax . - tamsulosin  (FLOMAX ) 0.4 MG CAPS capsule; Take 1 capsule (0.4 mg total) by mouth daily.  Dispense: 30 capsule; Refill: 11  3. History of bladder cancer With his remote history of bladder cancer, we discussed that we will plan for intraoperative cystoscopy and possible bilateral retrograde pyelograms if any recurrent tumor in the bladder is noted.  He agreed. - Ambulatory Referral For Surgery Scheduling   Return for Will call to schedule surgery.  Lucie Hones, PA-C  [facility] [address], [address] [place], [place] [address] [phone]

## 2024-01-18 NOTE — Progress Notes (Signed)
   [facility] Surgical Posting Form  Surgery Date: Date: 01/31/2024  Surgeon: Dr. Glendia [name], MD  Inpt ( No  )   Outpt (Yes)   Obs ( No  )   Diagnosis: N20.0 Left Nephrolithiasis, Z85.51 History of Bladder Cancer  -CPT: 52356, [phone]  Surgery: Left Ureteroscopy with Laser Lithotripsy and Stent Placement, Possible Cystoscopy with Left Retrograde Pyelogram  Stop Anticoagulations: No  Cardiac/Medical/Pulmonary Clearance needed: no  *Orders entered into EPIC  Date: 01/18/24   *Case booked in [place]  Date: 01/18/24  *Notified pt of Surgery: Date: 01/18/24  PRE-OP UA & CX: no  *Placed into Prior Authorization Work Que Date: 01/18/24  Assistant/laser/rep:No

## 2024-01-18 NOTE — Telephone Encounter (Signed)
 Per Dr. Twylla, Patient is to be scheduled for Left Ureteroscopy with Laser Lithotripsy and Stent Placement, Possible Cystoscopy with Left Retrograde Pyelogram   Manuel Bruce was contacted and possible surgical dates were discussed, Thursday August 21st, 2025 was agreed upon for surgery.   Patient was directed to call [phone] between 1-3pm the day before surgery to find out surgical arrival time.  Instructions were given not to eat or drink from midnight on the night before surgery and have a driver for the day of surgery. On the surgery day patient was instructed to enter through the Medical Mall entrance of [facility] report the Same Day Surgery desk.   Pre-Admit Testing will be in contact via phone to set up an interview with the anesthesia team to review your history and medications prior to surgery.   Reminder of this information was sent via MyChart to the patient.

## 2024-01-18 NOTE — Progress Notes (Signed)
 Surgical Physician Order Form [facility]  Dr. Twylla * Scheduling expectation : Next Available  *Length of Case:   *Clearance needed: no  *Anticoagulation Instructions: N/A  *Aspirin Instructions: N/A  *Post-op visit Date/Instructions:  1 week cysto stent removal  *Diagnosis: Left Nephrolithiasis + history of bladder cancer  *Procedure: left Ureteroscopy w/laser lithotripsy & stent placement (47643) + cysto with possible bilateral retrograde pyelogram   Additional orders: N/A  -Admit type: OUTpatient  -Anesthesia: General  -VTE Prophylaxis Standing Order SCD's       Other:   -Standing Lab Orders Per Anesthesia    Lab other: None  -Standing Test orders EKG/Chest x-ray per Anesthesia       Test other:   - Medications:  Ancef 2gm IV  -Other orders:  N/A

## 2024-01-18 NOTE — H&P (View-Only) (Signed)
 01/18/2024 8:59 AM   Manuel Bruce Apr 22, 1961 [id number]  CC: Chief Complaint  Patient presents with   Groin Pain   Establish Care   HPI: Manuel Bruce is a 63 y.o. male with PMH elevated PSA, remote bladder cancer, and nephrolithiasis who presents today for evaluation of possible acute stone episode.   He was seen by Dr. Twylla on 06/23/2018 for for elevated PSA.  Prostate MRI was recommended, however this was never performed.  Today he reports an approximate 66-month history of LLQ pain that he describes as a dull ache with intermittent stabbing.  He has had increased urinary frequency and weak stream since this started.  He denies fever, chills, nausea, or vomiting.  He has had 3 prior stone episodes, and always needed intervention for them.  He had a CTAP without contrast on 12/25/2023 for evaluation of the symptoms, which showed a large, 2.0 x 1.1 cm nonobstructing left renal stone.  Prostate is moderately enlarged.  With regard to his remote bladder cancer history, he states he never had any follow-up for this.  He thinks that the bladder tumor was removed, but does not recall.  He denies any gross hematuria.  In-office UA today positive for trace protein, 3+ blood, and trace leukocytes; urine microscopy with 6-10 WBCs/HPF, >30 RBCs/HPF, and moderate bacteria.  PMH: Past Medical History:  Diagnosis Date   Complication of anesthesia    Depression    Family history of adverse reaction to anesthesia    mom nausea   GERD (gastroesophageal reflux disease)    Gout    History of kidney stones    Hypertension    Insomnia    PONV (postoperative nausea and vomiting)    nausea    Surgical History: Past Surgical History:  Procedure Laterality Date   COLONOSCOPY     COLONOSCOPY WITH PROPOFOL  N/A 10/12/2017   Procedure: COLONOSCOPY WITH PROPOFOL ;  Surgeon: Gaylyn Gladis PENNER, MD;  Location: [facility];  Service: Endoscopy;  Laterality: N/A;   KIDNEY STONE SURGERY      TONSILLECTOMY     VASECTOMY      Home Medications:  Allergies as of 01/18/2024       Reactions   Codeine Nausea And Vomiting        Medication List        Accurate as of January 18, 2024  8:59 AM. If you have any questions, ask your nurse or doctor.          allopurinol 100 MG tablet Commonly known as: ZYLOPRIM Take 200 mg by mouth daily.   aluminum chloride 20 % external solution Commonly known as: DRYSOL Apply 20 % topically at bedtime.   buPROPion 150 MG 24 hr tablet Commonly known as: WELLBUTRIN XL Take 150 mg by mouth daily.   diphenhydrAMINE 50 MG capsule Commonly known as: BENADRYL Take 50 mg by mouth every 6 (six) hours as needed.   fluticasone 50 MCG/ACT nasal spray Commonly known as: FLONASE Place 2 sprays into both nostrils daily.   lisinopril 40 MG tablet Commonly known as: ZESTRIL Take 40 mg by mouth daily.   omeprazole 40 MG capsule Commonly known as: PRILOSEC Take 40 mg by mouth daily.   tadalafil 20 MG tablet Commonly known as: CIALIS Take 20 mg by mouth daily as needed for erectile dysfunction.   zolpidem 10 MG tablet Commonly known as: AMBIEN Take 10 mg by mouth at bedtime as needed for sleep.        Allergies:  Allergies  Allergen Reactions   Codeine Nausea And Vomiting    Family History: Family History  Problem Relation Age of Onset   Heart disease Mother    Heart disease Father     Social History:   reports that he has never smoked. He has never used smokeless tobacco. He reports current alcohol use of about 6.0 - 8.0 standard drinks of alcohol per week. He reports that he does not use drugs.  Physical Exam: BP (!) 142/87 (BP Location: Left Arm, Patient Position: Sitting, Cuff Size: Normal)   Pulse 91   Ht 5' 10 (1.778 m)   Wt 204 lb (92.5 kg)   SpO2 94%   BMI 29.27 kg/m   Constitutional:  Alert and oriented, no acute distress, nontoxic appearing HEENT: [place], AT Cardiovascular: No clubbing, cyanosis, or  edema Respiratory: Normal respiratory effort, no increased work of breathing Skin: No rashes, bruises or suspicious lesions Neurologic: Grossly intact, no focal deficits, moving all 4 extremities Psychiatric: Normal mood and affect  Laboratory Data: Results for orders placed or performed in visit on 01/18/24  Microscopic Examination   Collection Time: 01/18/24  8:43 AM   Urine  Result Value Ref Range   WBC, UA 6-10 (A) 0 - 5 /hpf   RBC, Urine >30 (A) 0 - 2 /hpf   Epithelial Cells (non renal) 0-10 0 - 10 /hpf   Casts Present (A) None seen /lpf   Cast Type Granular casts (A) N/A   Bacteria, UA Moderate (A) None seen/Few  Urinalysis, Complete   Collection Time: 01/18/24  8:43 AM  Result Value Ref Range   Specific Gravity, UA 1.025 1.005 - 1.030   pH, UA 6.0 5.0 - 7.5   Color, UA Yellow Yellow   Appearance Ur Clear Clear   Leukocytes,UA Trace (A) Negative   Protein,UA Trace Negative/Trace   Glucose, UA Negative Negative   Ketones, UA Negative Negative   RBC, UA 3+ (A) Negative   Bilirubin, UA Negative Negative   Urobilinogen, Ur 0.2 0.2 - 1.0 mg/dL   Nitrite, UA Negative Negative   Microscopic Examination See below:    Pertinent Imaging: CTAP without contrast, 12/25/2023: EXAM:  CT ABDOMEN PELVIS WITHOUT IV CONTRAST   INDICATION:  Left lower quadrant pain for 3 months.   TECHNIQUE: Spiral CT scanning was performed through the abdomen and pelvis with no contrast.   FINDINGS: There is no significant abnormality identified in the lung bases. There is diffuse hepatic steatosis. The spleen, pancreas and gallbladder are within normal limits. A 2.0 x 1.1 cm calculus is present in the left renal pelvis. There are smaller left intrarenal calculi. No hydronephrosis is present. There is cortical scarring in the mid and lower poles. Bilateral renal cysts are present which do not require follow-up. No ureteral calculi are visualized. There are no adrenal masses. The abdominal bowel  loops are within normal limits. There is no evidence of ascites or adenopathy. No abdominal aortic aneurysm is present.   Moderate enlargement of the prostate is present. The median lobe indents the bladder base. No bladder calculi are present. Sigmoid diverticulosis is present with no associated inflammation.   There is no fracture or bone destruction.   IMPRESSION: 1. Left nephrolithiasis as described above. 2. No evidence of ureteral calculus or obstruction. 3. Left renal cortical scarring, most likely from previous infections. 4. Additional nonacute findings as described above.     Please note that CT scanning at this site utilizes multiple dose reduction techniques, including  automatic exposure control, adjustment of the MAA and/or KVP according to the patient's size, and use of iterative reconstruction.   Electronically signed by: Eddy Oar MD 12/26/2023 02:54 PM EDT RP Workstation: [id number]  I personally reviewed the images referenced above and note an enlarged prostate and a large, nonobstructing left renal stone.  Assessment & Plan:   1. Left renal stone (Primary) Large left renal stone.  I suspect this is the source of his pyuria and microscopic hematuria.  Will send for preop urine culture today.  We discussed that he is unlikely to spontaneously pass the stone and I recommended ureteroscopy for definitive management.  We discussed risks of surgery including infection, bleeding, and damage to surrounding structures.  We discussed that he will require a stent postoperatively.  He is tolerated these reasonably well in the past.  I expect based on stone size that he will keep the stent in place for 7 to 10 days and will require cystoscopy stent removal in clinic.  He expressed understanding and is in agreement with this plan. - Urinalysis, Complete - CULTURE, URINE COMPREHENSIVE - Ambulatory Referral For Surgery Scheduling  2. Weak urinary stream I suspect his recent  frequency and weak stream is more due to his BPH than his stone.  Will start him on Flomax . - tamsulosin  (FLOMAX ) 0.4 MG CAPS capsule; Take 1 capsule (0.4 mg total) by mouth daily.  Dispense: 30 capsule; Refill: 11  3. History of bladder cancer With his remote history of bladder cancer, we discussed that we will plan for intraoperative cystoscopy and possible bilateral retrograde pyelograms if any recurrent tumor in the bladder is noted.  He agreed. - Ambulatory Referral For Surgery Scheduling   Return for Will call to schedule surgery.  Lucie Hones, PA-C  [facility] [address], [address] [place], [place] [address] [phone]

## 2024-01-21 LAB — CULTURE, URINE COMPREHENSIVE

## 2024-01-22 ENCOUNTER — Ambulatory Visit: Payer: Self-pay | Admitting: Physician Assistant

## 2024-01-25 ENCOUNTER — Encounter
Admission: RE | Admit: 2024-01-25 | Discharge: 2024-01-25 | Disposition: A | Discharge status: Home / Self Care | Source: Ambulatory Visit | Attending: Urology | Admitting: Urology

## 2024-01-25 ENCOUNTER — Other Ambulatory Visit: Payer: Self-pay

## 2024-01-25 DIAGNOSIS — Z01810 Encounter for preprocedural cardiovascular examination: Secondary | ICD-10-CM

## 2024-01-25 DIAGNOSIS — Z01812 Encounter for preprocedural laboratory examination: Secondary | ICD-10-CM

## 2024-01-25 DIAGNOSIS — I10 Essential (primary) hypertension: Secondary | ICD-10-CM

## 2024-01-25 DIAGNOSIS — N200 Calculus of kidney: Secondary | ICD-10-CM

## 2024-01-25 HISTORY — DX: Malignant neoplasm of bladder, unspecified: C67.9

## 2024-01-25 HISTORY — DX: Male erectile dysfunction, unspecified: N52.9

## 2024-01-25 HISTORY — DX: Elevated prostate specific antigen (PSA): R97.20

## 2024-01-25 HISTORY — DX: Benign prostatic hyperplasia without lower urinary tract symptoms: N40.0

## 2024-01-25 NOTE — Patient Instructions (Addendum)
 Your procedure is scheduled on:01-31-24 Thursday Report to the Registration Desk on the 1st floor of the Medical Mall.Then proceed to the 2nd floor Surgery Desk To find out your arrival time, please call [phone] between 1PM - 3PM on:01-30-24 Wednesday If your arrival time is 6:00 am, do not arrive before that time as the Medical Mall entrance doors do not open until 6:00 am.  REMEMBER: Instructions that are not followed completely may result in serious medical risk, up to and including death; or upon the discretion of your surgeon and anesthesiologist your surgery may need to be rescheduled.  Do not eat food OR drink liquids after midnight the night before surgery.  No gum chewing or hard candies.  One week prior to surgery:Stop NOW (01-25-24) Stop Anti-inflammatories (NSAIDS) such as Advil, Aleve, Ibuprofen, Motrin, Naproxen, Naprosyn and Aspirin based products such as Excedrin, Goody's Powder, BC Powder. Stop ANY OVER THE COUNTER supplements until after surgery.  You may however, continue to take Tylenol  if needed for pain up until the day of surgery.  Stop tadalafil (CIALIS) 2 days prior to surgery-Last dose will be on 01-28-24 Monday  Continue taking all of your other prescription medications up until the day of surgery.  ON THE DAY OF SURGERY ONLY TAKE THESE MEDICATIONS WITH SIPS OF WATER : -buPROPion (WELLBUTRIN XL)  -allopurinol (ZYLOPRIM)   No Alcohol for 24 hours before or after surgery.  No Smoking including e-cigarettes for 24 hours before surgery.  No chewable tobacco products for at least 6 hours before surgery.  No nicotine patches on the day of surgery.  Do not use any recreational drugs for at least a week (preferably 2 weeks) before your surgery.  Please be advised that the combination of cocaine and anesthesia may have negative outcomes, up to and including death. If you test positive for cocaine, your surgery will be cancelled.  On the morning of surgery  brush your teeth with toothpaste and water , you may rinse your mouth with mouthwash if you wish. Do not swallow any toothpaste or mouthwash.  Do not wear jewelry, make-up, hairpins, clips or nail polish.  For welded (permanent) jewelry: bracelets, anklets, waist bands, etc.  Please have this removed prior to surgery.  If it is not removed, there is a chance that hospital personnel will need to cut it off on the day of surgery.  Do not wear lotions, powders, or perfumes.   Do not shave body hair from the neck down 48 hours before surgery.  Contact lenses, hearing aids and dentures may not be worn into surgery.  Do not bring valuables to the hospital. [facility] is not responsible for any missing/lost belongings or valuables.   Notify your doctor if there is any change in your medical condition (cold, fever, infection).  Wear comfortable clothing (specific to your surgery type) to the hospital.  After surgery, you can help prevent lung complications by doing breathing exercises.  Take deep breaths and cough every 1-2 hours. Your doctor may order a device called an Incentive Spirometer to help you take deep breaths. When coughing or sneezing, hold a pillow firmly against your incision with both hands. This is called "splinting." Doing this helps protect your incision. It also decreases belly discomfort.  If you are being admitted to the hospital overnight, leave your suitcase in the car. After surgery it may be brought to your room.  In case of increased patient census, it may be necessary for you, the patient, to continue your postoperative  care in the Same Day Surgery department.  If you are being discharged the day of surgery, you will not be allowed to drive home. You will need a responsible individual to drive you home and stay with you for 24 hours after surgery.   If you are taking public transportation, you will need to have a responsible individual with you.  Please call the  Pre-admissions Testing Dept. at [phone] if you have any questions about these instructions.  Surgery Visitation Policy:  Patients having surgery or a procedure may have two visitors.  Children under the age of 54 must have an adult with them who is not the patient.   Merchandiser, retail to address health-related social needs:  [email or web address]

## 2024-01-28 ENCOUNTER — Encounter
Admission: RE | Admit: 2024-01-28 | Discharge: 2024-01-28 | Disposition: A | Discharge status: Home / Self Care | Source: Ambulatory Visit | Attending: Urology | Admitting: Urology

## 2024-01-28 DIAGNOSIS — Z01818 Encounter for other preprocedural examination: Secondary | ICD-10-CM | POA: Insufficient documentation

## 2024-01-28 DIAGNOSIS — Z01810 Encounter for preprocedural cardiovascular examination: Secondary | ICD-10-CM

## 2024-01-28 DIAGNOSIS — Z01812 Encounter for preprocedural laboratory examination: Secondary | ICD-10-CM

## 2024-01-28 DIAGNOSIS — K219 Gastro-esophageal reflux disease without esophagitis: Secondary | ICD-10-CM | POA: Diagnosis not present

## 2024-01-28 DIAGNOSIS — N200 Calculus of kidney: Secondary | ICD-10-CM | POA: Diagnosis not present

## 2024-01-28 DIAGNOSIS — I10 Essential (primary) hypertension: Secondary | ICD-10-CM | POA: Diagnosis not present

## 2024-01-28 DIAGNOSIS — N401 Enlarged prostate with lower urinary tract symptoms: Secondary | ICD-10-CM | POA: Diagnosis not present

## 2024-01-28 DIAGNOSIS — Z8551 Personal history of malignant neoplasm of bladder: Secondary | ICD-10-CM | POA: Diagnosis not present

## 2024-01-28 DIAGNOSIS — R3912 Poor urinary stream: Secondary | ICD-10-CM | POA: Diagnosis not present

## 2024-01-28 LAB — BASIC METABOLIC PANEL WITH GFR
Anion gap: 11 (ref 5–15)
BUN: 16 mg/dL (ref 8–23)
CO2: 24 mmol/L (ref 22–32)
Calcium: 9.6 mg/dL (ref 8.9–10.3)
Chloride: 107 mmol/L (ref 98–111)
Creatinine, Ser: 1.14 mg/dL (ref 0.61–1.24)
GFR, Estimated: >60 mL/min
Glucose, Bld: 175 mg/dL — ABNORMAL HIGH (ref 70–99)
Potassium: 3.8 mmol/L (ref 3.5–5.1)
Sodium: 142 mmol/L (ref 135–145)

## 2024-01-28 LAB — CBC
HCT: 50.8 % (ref 39.0–52.0)
Hemoglobin: 18 g/dL — ABNORMAL HIGH (ref 13.0–17.0)
MCH: 30.2 pg (ref 26.0–34.0)
MCHC: 35.4 g/dL (ref 30.0–36.0)
MCV: 85.2 fL (ref 80.0–100.0)
Platelets: 147 K/uL — ABNORMAL LOW (ref 150–400)
RBC: 5.96 MIL/uL — ABNORMAL HIGH (ref 4.22–5.81)
RDW: 12.1 % (ref 11.5–15.5)
WBC: 5.5 K/uL (ref 4.0–10.5)
nRBC: 0 % (ref 0.0–0.2)

## 2024-01-31 ENCOUNTER — Ambulatory Visit

## 2024-01-31 ENCOUNTER — Other Ambulatory Visit: Payer: Self-pay

## 2024-01-31 ENCOUNTER — Ambulatory Visit
Admission: RE | Admit: 2024-01-31 | Discharge: 2024-01-31 | Disposition: A | Discharge status: Home / Self Care | Attending: Urology | Admitting: Urology

## 2024-01-31 ENCOUNTER — Encounter
Admission: RE | Disposition: A | Discharge status: Home / Self Care | Payer: Self-pay | Source: Home / Self Care | Attending: Urology

## 2024-01-31 ENCOUNTER — Ambulatory Visit: Payer: Self-pay | Admitting: Urgent Care

## 2024-01-31 ENCOUNTER — Ambulatory Visit: Payer: Self-pay

## 2024-01-31 ENCOUNTER — Encounter: Payer: Self-pay | Admitting: Urology

## 2024-01-31 DIAGNOSIS — K219 Gastro-esophageal reflux disease without esophagitis: Secondary | ICD-10-CM | POA: Diagnosis not present

## 2024-01-31 DIAGNOSIS — Z8551 Personal history of malignant neoplasm of bladder: Secondary | ICD-10-CM | POA: Insufficient documentation

## 2024-01-31 DIAGNOSIS — R3912 Poor urinary stream: Secondary | ICD-10-CM | POA: Diagnosis not present

## 2024-01-31 DIAGNOSIS — I10 Essential (primary) hypertension: Secondary | ICD-10-CM | POA: Diagnosis not present

## 2024-01-31 DIAGNOSIS — N401 Enlarged prostate with lower urinary tract symptoms: Secondary | ICD-10-CM | POA: Diagnosis not present

## 2024-01-31 DIAGNOSIS — N200 Calculus of kidney: Secondary | ICD-10-CM

## 2024-01-31 HISTORY — PX: CYSTOSCOPY W/ RETROGRADES: SHX1426

## 2024-01-31 HISTORY — PX: CYSTOSCOPY/URETEROSCOPY/HOLMIUM LASER/STENT PLACEMENT: SHX6546

## 2024-01-31 SURGERY — CYSTOSCOPY/URETEROSCOPY/HOLMIUM LASER/STENT PLACEMENT
Anesthesia: General | Site: Ureter | Laterality: Left

## 2024-01-31 MED ORDER — DEXAMETHASONE SODIUM PHOSPHATE 10 MG/ML IJ SOLN
INTRAMUSCULAR | Status: DC | PRN
  Administered 2024-01-31: 8 mg via INTRAVENOUS

## 2024-01-31 MED ORDER — FENTANYL CITRATE (PF) 100 MCG/2ML IJ SOLN
INTRAMUSCULAR | Status: DC | PRN
  Administered 2024-01-31: 25 ug via INTRAVENOUS
  Administered 2024-01-31 (×2): 50 ug via INTRAVENOUS

## 2024-01-31 MED ORDER — PROPOFOL 10 MG/ML IV BOLUS
INTRAVENOUS | Status: AC
  Filled 2024-01-31: qty 20

## 2024-01-31 MED ORDER — IOHEXOL 180 MG/ML  SOLN
INTRAMUSCULAR | Status: DC | PRN
  Administered 2024-01-31: 10 mL

## 2024-01-31 MED ORDER — OXYBUTYNIN CHLORIDE 5 MG PO TABS: ORAL_TABLET | ORAL | 0 refills | Status: DC

## 2024-01-31 MED ORDER — PROPOFOL 500 MG/50ML IV EMUL
INTRAVENOUS | Status: DC | PRN
  Administered 2024-01-31: 150 ug/kg/min via INTRAVENOUS

## 2024-01-31 MED ORDER — OXYCODONE HCL 5 MG PO TABS: 5.0000 mg | ORAL_TABLET | Freq: Once | ORAL | Status: DC | PRN

## 2024-01-31 MED ORDER — FENTANYL CITRATE (PF) 100 MCG/2ML IJ SOLN
INTRAMUSCULAR | Status: AC
  Filled 2024-01-31: qty 2

## 2024-01-31 MED ORDER — LACTATED RINGERS IV SOLN: INTRAVENOUS | Status: DC | PRN

## 2024-01-31 MED ORDER — FENTANYL CITRATE (PF) 100 MCG/2ML IJ SOLN: 25.0000 ug | INTRAMUSCULAR | Status: DC | PRN

## 2024-01-31 MED ORDER — ROCURONIUM BROMIDE 100 MG/10ML IV SOLN
INTRAVENOUS | Status: DC | PRN
  Administered 2024-01-31: 20 mg via INTRAVENOUS
  Administered 2024-01-31: 60 mg via INTRAVENOUS
  Administered 2024-01-31: 20 mg via INTRAVENOUS

## 2024-01-31 MED ORDER — CHLORHEXIDINE GLUCONATE 0.12 % MT SOLN
OROMUCOSAL | Status: AC
Start: 2024-01-31 — End: 2024-01-31
  Filled 2024-01-31: qty 15

## 2024-01-31 MED ORDER — ONDANSETRON HCL 4 MG/2ML IJ SOLN
INTRAMUSCULAR | Status: DC | PRN
  Administered 2024-01-31: 4 mg via INTRAVENOUS

## 2024-01-31 MED ORDER — SODIUM CHLORIDE 0.9 % IR SOLN
Status: DC | PRN
  Administered 2024-01-31 (×2): 3000 mL

## 2024-01-31 MED ORDER — ALFUZOSIN HCL ER 10 MG PO TB24: 10.0000 mg | ORAL_TABLET | Freq: Every day | ORAL | 0 refills | Status: DC

## 2024-01-31 MED ORDER — ALBUTEROL SULFATE HFA 108 (90 BASE) MCG/ACT IN AERS
INHALATION_SPRAY | RESPIRATORY_TRACT | Status: DC | PRN
Start: 2024-01-31 — End: 2024-01-31
  Administered 2024-01-31: 4 via RESPIRATORY_TRACT

## 2024-01-31 MED ORDER — LACTATED RINGERS IV SOLN: INTRAVENOUS | Status: DC

## 2024-01-31 MED ORDER — OXYCODONE HCL 5 MG/5ML PO SOLN: 5.0000 mg | Freq: Once | ORAL | Status: DC | PRN

## 2024-01-31 MED ORDER — STERILE WATER FOR IRRIGATION IR SOLN
Status: DC | PRN
  Administered 2024-01-31: 500 mL

## 2024-01-31 MED ORDER — GLYCOPYRROLATE 0.2 MG/ML IJ SOLN
INTRAMUSCULAR | Status: DC | PRN
  Administered 2024-01-31 (×2): .1 mg via INTRAVENOUS

## 2024-01-31 MED ORDER — SUGAMMADEX SODIUM 200 MG/2ML IV SOLN
INTRAVENOUS | Status: DC | PRN
  Administered 2024-01-31: 150 mg via INTRAVENOUS

## 2024-01-31 MED ORDER — PHENYLEPHRINE 80 MCG/ML (10ML) SYRINGE FOR IV PUSH (FOR BLOOD PRESSURE SUPPORT)
PREFILLED_SYRINGE | INTRAVENOUS | Status: DC | PRN
  Administered 2024-01-31: 80 ug via INTRAVENOUS
  Administered 2024-01-31: 160 ug via INTRAVENOUS

## 2024-01-31 MED ORDER — PROPOFOL 10 MG/ML IV BOLUS
INTRAVENOUS | Status: DC | PRN
  Administered 2024-01-31 (×2): 30 mg via INTRAVENOUS
  Administered 2024-01-31: 150 mg via INTRAVENOUS
  Administered 2024-01-31: 30 mg via INTRAVENOUS

## 2024-01-31 MED ORDER — CEFAZOLIN SODIUM-DEXTROSE 2-4 GM/100ML-% IV SOLN
INTRAVENOUS | Status: AC
  Filled 2024-01-31: qty 100

## 2024-01-31 MED ORDER — CHLORHEXIDINE GLUCONATE 0.12 % MT SOLN
15.0000 mL | Freq: Once | OROMUCOSAL | Status: AC
  Administered 2024-01-31: 15 mL via OROMUCOSAL

## 2024-01-31 MED ORDER — HYDROCODONE-ACETAMINOPHEN 5-325 MG PO TABS
1.0000 | ORAL_TABLET | Freq: Four times a day (QID) | ORAL | 0 refills | Status: DC | PRN
Start: 2024-01-31 — End: 2024-03-11

## 2024-01-31 MED ORDER — CEFAZOLIN SODIUM-DEXTROSE 2-4 GM/100ML-% IV SOLN
2.0000 g | INTRAVENOUS | Status: AC
  Administered 2024-01-31: 2 g via INTRAVENOUS

## 2024-01-31 MED ORDER — LIDOCAINE HCL (CARDIAC) PF 100 MG/5ML IV SOSY
PREFILLED_SYRINGE | INTRAVENOUS | Status: DC | PRN
  Administered 2024-01-31: 100 mg via INTRAVENOUS

## 2024-01-31 MED ORDER — ORAL CARE MOUTH RINSE: 15.0000 mL | Freq: Once | OROMUCOSAL | Status: AC

## 2024-01-31 SURGICAL SUPPLY — 23 items
BAG DRAIN SIEMENS DORNER NS (MISCELLANEOUS) ×1 IMPLANT
BASKET ZERO TIP 1.9FR (BASKET) IMPLANT
BRUSH SCRUB EZ 4% CHG (MISCELLANEOUS) ×1 IMPLANT
CATH URET FLEX-TIP 2 LUMEN 10F (CATHETERS) IMPLANT
CATH URETL OPEN END 6X70 (CATHETERS) ×1 IMPLANT
CNTNR URN SCR LID CUP LEK RST (MISCELLANEOUS) IMPLANT
DRAPE UTILITY 15X26 TOWEL STRL (DRAPES) ×1 IMPLANT
FIBER LASER MOSES 200 DFL (Laser) IMPLANT
FIBER LASER MOSES 365 DFL (Laser) ×1 IMPLANT
GLOVE BIOGEL PI IND STRL 7.5 (GLOVE) ×1 IMPLANT
GOWN STRL REUS W/ TWL LRG LVL3 (GOWN DISPOSABLE) ×1 IMPLANT
GOWN STRL REUS W/ TWL XL LVL3 (GOWN DISPOSABLE) ×1 IMPLANT
GUIDEWIRE STR DUAL SENSOR (WIRE) ×1 IMPLANT
KIT TURNOVER CYSTO (KITS) ×1 IMPLANT
PACK CYSTO AR (MISCELLANEOUS) ×1 IMPLANT
SET CYSTO W/LG BORE CLAMP LF (SET/KITS/TRAYS/PACK) ×1 IMPLANT
SHEATH NAVIGATOR HD 12/14X36 (SHEATH) IMPLANT
SOL .9 NS 3000ML IRR UROMATIC (IV SOLUTION) ×1 IMPLANT
STENT URET 6FRX24 CONTOUR (STENTS) IMPLANT
STENT URET 6FRX26 CONTOUR (STENTS) IMPLANT
SURGILUBE 2OZ TUBE FLIPTOP (MISCELLANEOUS) ×1 IMPLANT
VALVE UROSEAL ADJ ENDO (VALVE) IMPLANT
WATER STERILE IRR 500ML POUR (IV SOLUTION) ×1 IMPLANT

## 2024-01-31 NOTE — Interval H&P Note (Signed)
 History and Physical Interval Note:  CV:RRR Lungs:clear  01/31/2024 11:39 AM  Manuel Bruce  has presented today for surgery, with the diagnosis of Left Nephrolithiasis, History of Bladder Cancer.  The various methods of treatment have been discussed with the patient and family. After consideration of risks, benefits and other options for treatment, the patient has consented to  Procedure(s): CYSTOSCOPY/URETEROSCOPY/HOLMIUM LASER/STENT PLACEMENT (Left) CYSTOSCOPY, WITH RETROGRADE PYELOGRAM (Left) as a surgical intervention.  The patient's history has been reviewed, patient examined, no change in status, stable for surgery.  I have reviewed the patient's chart and labs.  Questions were answered to the patient's satisfaction.     Netha Dafoe C Gwenevere Goga

## 2024-01-31 NOTE — Op Note (Signed)
 Preoperative diagnosis:  Left nephrolithiasis   Postoperative diagnosis:  Left nephrolithiasis  Procedure:  Cystoscopy Left ureteroscopy and stone removal Ureteroscopic laser lithotripsy Left ureteral stent placement (3F/26 cm)  Left retrograde pyelography with interpretation  Surgeon: Glendia C. Alorah Mcree, M.D.  Anesthesia: General  Complications: None  Intraoperative findings:  Cystoscopy: Urethra normal in caliber without stricture.  Prostate with prominent lateral lobe enlargement and mild elevation bladder neck.  Bladder mucosa without solid or papillary lesions.  Ureteropyeloscopy: Large renal pelvic calculus  Left retrograde pyelography demonstrated a filling defect within the renal pelvis consistent with the patient's known calculus without other abnormalities. Left retrograde pyelography post procedure showed no filling defects, stone fragments or contrast extravasation  EBL: Minimal  Specimens: Calculus fragments for analysis   Indication: Manuel Bruce is a 63 y.o. patient with a ~10 x 10 x 20 mm left renal pelvic calculus.  Refer to the admission H&P for details.  After reviewing the management options for treatment, the patient elected to proceed with the above surgical procedure(s). We have discussed the potential benefits and risks of the procedure, side effects of the proposed treatment, the likelihood of the patient achieving the goals of the procedure, and any potential problems that might occur during the procedure or recuperation. Informed consent has been obtained.  Description of procedure:  The patient was taken to the operating room and general anesthesia was induced.  The patient was placed in the dorsal lithotomy position, prepped and draped in the usual sterile fashion, and preoperative antibiotics were administered. A preoperative time-out was performed.   A 21 Fr cystoscope was lubricated, placed per urethra and advanced proximally into the bladder  under direct vision.  Panendoscopy was performed with findings described above.    Attention was directed to the left ureteral orifice and a 0.038 Sensor wire was then advanced up the ureter into the renal pelvis under fluoroscopic guidance.  The cystoscope was removed and a dual-lumen catheter was placed over the Sensor wire.  Retrograde pyelogram was then performed with findings as described above. A second sensor wire was placed in a similar fashion and the dual-lumen catheter was removed.  A 12/14 French ureteral access sheath was placed over the working wire under fluoroscopic guidance without difficulty.  A single channel digital flexible ureteroscope was placed through the access sheath and advanced into the renal pelvis without difficulty.  Pyeloscopy was performed with findings as described above.    A 365 m Moses holmium laser fiber was placed through the ureteroscope and the stone was dusted at an initial setting of 0.3 J/80 hz and subsequently increased to 0.3J/120 Hz.  After dusting was completed pyeloscopy was performed and several fragments that chipped off the main stone during dusting had settled in lower pole calyces.  These were treated with noncontact laser lithotripsy at a setting of 0.3J/120 Hz until no significantly sized stone fragments were identified.  Several small fragments were removed with a 1.54F 0-tip nitinol basket and sent for analysis.   Retrograde pyelogram was performed and each calyx was sequentially examined under fluoroscopic guidance and no significant size fragments were identified.  The ureteral access sheath and ureteroscope were removed in tandem and the ureter showed no evidence of injury or perforation.  2 fragments were identified in the proximal ureter which were removed with the basket.  A 6 F/26 cm Contour ureteral stent was placed under fluoroscopic guidance.  The wire was then removed with an adequate stent curl noted in the  renal pelvis as well as  in the bladder.  The bladder was then emptied and the procedure ended.  The patient appeared to tolerate the procedure well and without complications.  After anesthetic reversal the patient was transported to the PACU in stable condition.    Plan: KUB next week and if no significant fragments not identified he will be scheduled for stent removal after 7-10 days dwell time   Glendia [name], MD

## 2024-01-31 NOTE — Discharge Instructions (Addendum)
 DISCHARGE INSTRUCTIONS FOR KIDNEY STONE/URETERAL STENT   MEDICATIONS:  1. Resume all your other meds from home.  2.  AZO (over-the-counter) can help with the burning/stinging when you urinate. 3.  Hydrocodone  is for moderate/severe pain, Rx was sent to your pharmacy. 4.  Alfuzosin  and oxybutynin  are for bladder/stent irritation.  Rxs were sent to your pharmacy  ACTIVITY:  1. May resume regular activities in 24 hours. 2. No driving while on narcotic pain medications  3. Drink plenty of water   4. Continue to walk at home - you can still get blood clots when you are at home, so keep active, but don't over do it.  5. May return to work/school tomorrow or when you feel ready    SIGNS/SYMPTOMS TO CALL:  Common postoperative symptoms include urinary frequency, urgency, bladder spasm and blood in the urine  Please call us  if you have a fever greater than 101.5, uncontrolled nausea/vomiting, uncontrolled pain, dizziness, unable to urinate, excessively bloody urine, chest pain, shortness of breath, leg swelling, leg pain, or any other concerns or questions.   You can reach us  at [phone].   FOLLOW-UP:  1. You have a follow-up appointment scheduled you will be contacted for a follow-up appointment for stent removal in 7-10 days

## 2024-01-31 NOTE — Anesthesia Preprocedure Evaluation (Signed)
 Anesthesia Evaluation  Patient identified by MRN, date of birth, ID band Patient awake    Reviewed: Allergy & Precautions, H&P , NPO status , Patient's Chart, lab work & pertinent test results, reviewed documented beta blocker date and time   History of Anesthesia Complications (+) PONV and history of anesthetic complications  Airway Mallampati: II   Neck ROM: full    Dental  (+) Teeth Intact   Pulmonary neg pulmonary ROS   Pulmonary exam normal        Cardiovascular Exercise Tolerance: Good hypertension, On Medications negative cardio ROS Normal cardiovascular exam Rhythm:regular Rate:Normal     Neuro/Psych  PSYCHIATRIC DISORDERS  Depression    negative neurological ROS  negative psych ROS   GI/Hepatic negative GI ROS, Neg liver ROS,GERD  ,,  Endo/Other  negative endocrine ROS    Renal/GU      Musculoskeletal   Abdominal   Peds  Hematology negative hematology ROS (+)   Anesthesia Other Findings Past Medical History: No date: Complication of anesthesia No date: Depression No date: Family history of adverse reaction to anesthesia     Comment:  mom nausea No date: GERD (gastroesophageal reflux disease) No date: Gout No date: History of kidney stones No date: Hypertension No date: Insomnia No date: PONV (postoperative nausea and vomiting)     Comment:  nausea Past Surgical History: No date: COLONOSCOPY No date: KIDNEY STONE SURGERY No date: TONSILLECTOMY No date: VASECTOMY BMI    Body Mass Index:  28.41 kg/m     Reproductive/Obstetrics negative OB ROS                              Anesthesia Physical Anesthesia Plan  ASA: 2  Anesthesia Plan: General ETT   Post-op Pain Management:    Induction: Intravenous  PONV Risk Score and Plan: 4 or greater and Ondansetron , Dexamethasone , Midazolam , Propofol  infusion and TIVA  Airway Management Planned: Oral ETT  Additional  Equipment:   Intra-op Plan:   Post-operative Plan: Extubation in OR  Informed Consent: I have reviewed the patients History and Physical, chart, labs and discussed the procedure including the risks, benefits and alternatives for the proposed anesthesia with the patient or authorized representative who has indicated his/her understanding and acceptance.     Dental Advisory Given  Plan Discussed with: Anesthesiologist, CRNA and Surgeon  Anesthesia Plan Comments: (Patient consented for risks of anesthesia including but not limited to:  - adverse reactions to medications - damage to eyes, teeth, lips or other oral mucosa - nerve damage due to positioning  - sore throat or hoarseness - Damage to heart, brain, nerves, lungs, other parts of body or loss of life  Patient voiced understanding and assent.)        Anesthesia Quick Evaluation

## 2024-01-31 NOTE — Transfer of Care (Signed)
 Immediate Anesthesia Transfer of Care Note  Patient: Manuel Bruce  Procedure(s) Performed: CYSTOSCOPY/URETEROSCOPY/HOLMIUM LASER/STENT PLACEMENT (Left: Ureter) CYSTOSCOPY, WITH RETROGRADE PYELOGRAM (Left: Ureter)  Patient Location: PACU  Anesthesia Type:General  Level of Consciousness: awake  Airway & Oxygen Therapy: Patient Spontanous Breathing and Patient connected to face mask oxygen  Post-op Assessment: Report given to RN and Post -op Vital signs reviewed and stable  Post vital signs: Reviewed and stable  Last Vitals:  Vitals Value Taken Time  BP 112/63 01/31/24 14:15  Temp 36.3 C 01/31/24 14:15  Pulse 68 01/31/24 14:27  Resp 21 01/31/24 14:27  SpO2 92 % 01/31/24 14:27  Vitals shown include unfiled device data.  Last Pain:  Vitals:   01/31/24 1415  TempSrc:   PainSc: 0-No pain         Complications: No notable events documented.

## 2024-01-31 NOTE — Anesthesia Procedure Notes (Signed)
 Procedure Name: Intubation Date/Time: 01/31/2024 11:55 AM  Performed by: Brien Sotero PARAS, CRNAPre-anesthesia Checklist: Patient identified, Patient being monitored, Timeout performed, Emergency Drugs available and Suction available Patient Re-evaluated:Patient Re-evaluated prior to induction Oxygen Delivery Method: Circle system utilized Preoxygenation: Pre-oxygenation with 100% oxygen Induction Type: IV induction Ventilation: Mask ventilation without difficulty Laryngoscope Size: Mac, McGrath and 4 Grade View: Grade I Tube type: Oral Tube size: 7.0 mm Number of attempts: 1 Airway Equipment and Method: Stylet Placement Confirmation: ETT inserted through vocal cords under direct vision, positive ETCO2 and breath sounds checked- equal and bilateral Secured at: 21 cm Tube secured with: Tape Dental Injury: Teeth and Oropharynx as per pre-operative assessment

## 2024-01-31 NOTE — Interval H&P Note (Signed)
 History and Physical Interval Note:  01/31/2024 11:39 AM  [name]  has presented today for surgery, with the diagnosis of Left Nephrolithiasis, History of Bladder Cancer.  The various methods of treatment have been discussed with the patient and family. After consideration of risks, benefits and other options for treatment, the patient has consented to  Procedure(s): CYSTOSCOPY/URETEROSCOPY/HOLMIUM LASER/STENT PLACEMENT (Left) CYSTOSCOPY, WITH RETROGRADE PYELOGRAM (Left) as a surgical intervention.  The patient's history has been reviewed, patient examined, no change in status, stable for surgery.  Based on stone size we discussed the possible need for staged procedure.  We also discussed there is a low likelihood the stone may not be able to be treated secondary to ureteral anatomy and inability to access the renal pelvis with the ureteroscope.  If this were to occur he would need stent placement and follow-up ureteroscopy after a period of passive stent dilation.  We also discussed the possibility of stent symptoms/pain I have reviewed the patient's chart and labs.  Questions were answered to the patient's satisfaction.     Dinari Stgermaine C Gwynn Crossley

## 2024-02-01 ENCOUNTER — Other Ambulatory Visit: Payer: Self-pay

## 2024-02-01 ENCOUNTER — Encounter: Payer: Self-pay | Admitting: Urology

## 2024-02-01 DIAGNOSIS — N200 Calculus of kidney: Secondary | ICD-10-CM

## 2024-02-01 NOTE — Progress Notes (Signed)
 Patient to have KUB on Wednesday 08/27 am and will be contacted after for Stent removal timing.

## 2024-02-01 NOTE — Anesthesia Postprocedure Evaluation (Signed)
 Anesthesia Post Note  Patient: Manuel Bruce  Procedure(s) Performed: CYSTOSCOPY/URETEROSCOPY/HOLMIUM LASER/STENT PLACEMENT (Left: Ureter) CYSTOSCOPY, WITH RETROGRADE PYELOGRAM (Left: Ureter)  Patient location during evaluation: PACU Anesthesia Type: General Level of consciousness: awake and alert Pain management: pain level controlled Vital Signs Assessment: post-procedure vital signs reviewed and stable Respiratory status: spontaneous breathing, nonlabored ventilation, respiratory function stable and patient connected to nasal cannula oxygen Cardiovascular status: blood pressure returned to baseline and stable Postop Assessment: no apparent nausea or vomiting Anesthetic complications: no   No notable events documented.   Last Vitals:  Vitals:   01/31/24 1430 01/31/24 1503  BP: 110/74 129/81  Pulse: 66 66  Resp: 16 16  Temp: (!) 36.1 C (!) 36.2 C  SpO2: 93% 95%    Last Pain:  Vitals:   01/31/24 1503  TempSrc: Temporal  PainSc: 0-No pain                 Debby Mines

## 2024-02-06 ENCOUNTER — Encounter: Admitting: Urology

## 2024-02-06 ENCOUNTER — Ambulatory Visit
Admission: RE | Admit: 2024-02-06 | Discharge: 2024-02-06 | Disposition: A | Discharge status: Home / Self Care | Source: Ambulatory Visit | Attending: Urology | Admitting: Urology

## 2024-02-06 ENCOUNTER — Ambulatory Visit
Admission: RE | Admit: 2024-02-06 | Discharge: 2024-02-06 | Disposition: A | Discharge status: Home / Self Care | Attending: Urology | Admitting: Urology

## 2024-02-06 DIAGNOSIS — N200 Calculus of kidney: Secondary | ICD-10-CM | POA: Diagnosis not present

## 2024-02-06 DIAGNOSIS — Z87442 Personal history of urinary calculi: Secondary | ICD-10-CM | POA: Diagnosis not present

## 2024-02-13 LAB — STONE ANALYSIS
Calcium Oxalate Monohydrate: 80 %
Uric Acid Calculi: 20 %
Weight Calculi: 27 mg

## 2024-02-16 ENCOUNTER — Encounter: Payer: Self-pay | Admitting: Urology

## 2024-02-18 NOTE — Telephone Encounter (Signed)
 Left message on voice mail to let patient know he is scheduled for stent removal on 02/22/2024

## 2024-02-22 ENCOUNTER — Ambulatory Visit (INDEPENDENT_AMBULATORY_CARE_PROVIDER_SITE_OTHER): Admitting: Urology

## 2024-02-22 VITALS — BP 138/91 | HR 75 | Ht 70.0 in | Wt 200.0 lb

## 2024-02-22 DIAGNOSIS — N200 Calculus of kidney: Secondary | ICD-10-CM

## 2024-02-22 MED ORDER — SULFAMETHOXAZOLE-TRIMETHOPRIM 800-160 MG PO TABS: 1.0000 | ORAL_TABLET | Freq: Two times a day (BID) | ORAL | Status: DC

## 2024-02-22 NOTE — H&P (View-Only) (Signed)
 02/22/2024 9:35 AM   Manuel Bruce Nov 01, 1960 [id number]  Referring provider: Valora Agent, MD [address] [facility] [place],  [place] [address]  Chief Complaint  Patient presents with   Cysto Stent Removal    HPI: Manuel Bruce is a 63 y.o. male presents for follow-up visit.  Status post ureteroscopic removal of a large left renal pelvic calculus 01/31/2024. KUB performed 02/06/2024 with residual fragments.  He was sent a MyChart message regarding options of stent removal versus follow-up ureteroscopy.  Mild stent symptoms which are not severe   PMH: Past Medical History:  Diagnosis Date   Bladder cancer (HCC)    Complication of anesthesia    Depression    ED (erectile dysfunction)    Elevated PSA    Enlarged prostate    Family history of adverse reaction to anesthesia    mom nausea   GERD (gastroesophageal reflux disease)    Gout    History of kidney stones    Hypertension    Insomnia    PONV (postoperative nausea and vomiting)    nausea    Surgical History: Past Surgical History:  Procedure Laterality Date   COLONOSCOPY     COLONOSCOPY WITH PROPOFOL  N/A 10/12/2017   Procedure: COLONOSCOPY WITH PROPOFOL ;  Surgeon: Gaylyn Gladis PENNER, MD;  Location: [facility] ENDOSCOPY;  Service: Endoscopy;  Laterality: N/A;   CYSTOSCOPY W/ RETROGRADES Left 01/31/2024   Procedure: CYSTOSCOPY, WITH RETROGRADE PYELOGRAM;  Surgeon: Twylla Glendia BROCKS, MD;  Location: [facility] ORS;  Service: Urology;  Laterality: Left;   CYSTOSCOPY/URETEROSCOPY/HOLMIUM LASER/STENT PLACEMENT Left 01/31/2024   Procedure: CYSTOSCOPY/URETEROSCOPY/HOLMIUM LASER/STENT PLACEMENT;  Surgeon: Twylla Glendia BROCKS, MD;  Location: [facility] ORS;  Service: Urology;  Laterality: Left;   KIDNEY STONE SURGERY     KNEE ARTHROSCOPY Bilateral    NASAL SINUS SURGERY     TONSILLECTOMY     VASECTOMY      Home Medications:  Allergies as of 02/22/2024       Reactions   Codeine Nausea And Vomiting        Medication  List        Accurate as of February 22, 2024  9:35 AM. If you have any questions, ask your nurse or doctor.          alfuzosin  10 MG 24 hr tablet Commonly known as: UROXATRAL  Take 1 tablet (10 mg total) by mouth daily with breakfast.   allopurinol 100 MG tablet Commonly known as: ZYLOPRIM Take 200 mg by mouth every morning.   aluminum chloride 20 % external solution Commonly known as: DRYSOL Apply 20 % topically daily as needed (hyperhidrosis).   buPROPion 150 MG 24 hr tablet Commonly known as: WELLBUTRIN XL Take 150 mg by mouth every morning.   diphenhydrAMINE 50 MG capsule Commonly known as: BENADRYL Take 50 mg by mouth every 6 (six) hours as needed for allergies.   fluticasone 50 MCG/ACT nasal spray Commonly known as: FLONASE Place 2 sprays into both nostrils daily as needed for allergies.   HYDROcodone -acetaminophen  5-325 MG tablet Commonly known as: NORCO/VICODIN Take 1 tablet by mouth every 6 (six) hours as needed for moderate pain (pain score 4-6).   lisinopril 40 MG tablet Commonly known as: ZESTRIL Take 40 mg by mouth every morning.   omeprazole 40 MG capsule Commonly known as: PRILOSEC Take 40 mg by mouth at bedtime.   oxybutynin  5 MG tablet Commonly known as: DITROPAN  1 tab tid prn frequency,urgency, bladder spasm   tadalafil 20 MG tablet  Commonly known as: CIALIS Take 20 mg by mouth daily as needed for erectile dysfunction.   zolpidem 10 MG tablet Commonly known as: AMBIEN Take 10 mg by mouth at bedtime.        Allergies:  Allergies  Allergen Reactions   Codeine Nausea And Vomiting    Family History: Family History  Problem Relation Age of Onset   Heart disease Mother    Heart disease Father     Social History:  reports that he has never smoked. He has never used smokeless tobacco. He reports current alcohol use of about 6.0 - 8.0 standard drinks of alcohol per week. He reports that he does not use drugs.   Physical Exam: BP  (!) 138/91   Pulse 75   Ht 5' 10 (1.778 m)   Wt 200 lb (90.7 kg)   BMI 28.70 kg/m   Constitutional:  Alert, No acute distress. HEENT: [address] AT   Assessment & Plan:    1. Left nephrolithiasis  I think the majority of remaining fragments are small residual particles.  There may be a 3 mm fragment in a midpole calyx. Will repeat his KUB next week and if significant clearance we will schedule stent removal Given Gemtesa samples x 2 weeks    Glendia JAYSON [name], MD  [facility] [address] [place], [place] [address] [phone]

## 2024-02-22 NOTE — Progress Notes (Signed)
 02/22/2024 9:35 AM   Manuel Bruce Nov 01, 1960 [id number]  Referring provider: Valora Agent, MD [address] [facility] [place],  [place] [address]  Chief Complaint  Patient presents with   Cysto Stent Removal    HPI: Manuel Bruce is a 63 y.o. male presents for follow-up visit.  Status post ureteroscopic removal of a large left renal pelvic calculus 01/31/2024. KUB performed 02/06/2024 with residual fragments.  He was sent a MyChart message regarding options of stent removal versus follow-up ureteroscopy.  Mild stent symptoms which are not severe   PMH: Past Medical History:  Diagnosis Date   Bladder cancer (HCC)    Complication of anesthesia    Depression    ED (erectile dysfunction)    Elevated PSA    Enlarged prostate    Family history of adverse reaction to anesthesia    mom nausea   GERD (gastroesophageal reflux disease)    Gout    History of kidney stones    Hypertension    Insomnia    PONV (postoperative nausea and vomiting)    nausea    Surgical History: Past Surgical History:  Procedure Laterality Date   COLONOSCOPY     COLONOSCOPY WITH PROPOFOL  N/A 10/12/2017   Procedure: COLONOSCOPY WITH PROPOFOL ;  Surgeon: Gaylyn Gladis PENNER, MD;  Location: [facility] ENDOSCOPY;  Service: Endoscopy;  Laterality: N/A;   CYSTOSCOPY W/ RETROGRADES Left 01/31/2024   Procedure: CYSTOSCOPY, WITH RETROGRADE PYELOGRAM;  Surgeon: Twylla Glendia BROCKS, MD;  Location: [facility] ORS;  Service: Urology;  Laterality: Left;   CYSTOSCOPY/URETEROSCOPY/HOLMIUM LASER/STENT PLACEMENT Left 01/31/2024   Procedure: CYSTOSCOPY/URETEROSCOPY/HOLMIUM LASER/STENT PLACEMENT;  Surgeon: Twylla Glendia BROCKS, MD;  Location: [facility] ORS;  Service: Urology;  Laterality: Left;   KIDNEY STONE SURGERY     KNEE ARTHROSCOPY Bilateral    NASAL SINUS SURGERY     TONSILLECTOMY     VASECTOMY      Home Medications:  Allergies as of 02/22/2024       Reactions   Codeine Nausea And Vomiting        Medication  List        Accurate as of February 22, 2024  9:35 AM. If you have any questions, ask your nurse or doctor.          alfuzosin  10 MG 24 hr tablet Commonly known as: UROXATRAL  Take 1 tablet (10 mg total) by mouth daily with breakfast.   allopurinol 100 MG tablet Commonly known as: ZYLOPRIM Take 200 mg by mouth every morning.   aluminum chloride 20 % external solution Commonly known as: DRYSOL Apply 20 % topically daily as needed (hyperhidrosis).   buPROPion 150 MG 24 hr tablet Commonly known as: WELLBUTRIN XL Take 150 mg by mouth every morning.   diphenhydrAMINE 50 MG capsule Commonly known as: BENADRYL Take 50 mg by mouth every 6 (six) hours as needed for allergies.   fluticasone 50 MCG/ACT nasal spray Commonly known as: FLONASE Place 2 sprays into both nostrils daily as needed for allergies.   HYDROcodone -acetaminophen  5-325 MG tablet Commonly known as: NORCO/VICODIN Take 1 tablet by mouth every 6 (six) hours as needed for moderate pain (pain score 4-6).   lisinopril 40 MG tablet Commonly known as: ZESTRIL Take 40 mg by mouth every morning.   omeprazole 40 MG capsule Commonly known as: PRILOSEC Take 40 mg by mouth at bedtime.   oxybutynin  5 MG tablet Commonly known as: DITROPAN  1 tab tid prn frequency,urgency, bladder spasm   tadalafil 20 MG tablet  Commonly known as: CIALIS Take 20 mg by mouth daily as needed for erectile dysfunction.   zolpidem 10 MG tablet Commonly known as: AMBIEN Take 10 mg by mouth at bedtime.        Allergies:  Allergies  Allergen Reactions   Codeine Nausea And Vomiting    Family History: Family History  Problem Relation Age of Onset   Heart disease Mother    Heart disease Father     Social History:  reports that he has never smoked. He has never used smokeless tobacco. He reports current alcohol use of about 6.0 - 8.0 standard drinks of alcohol per week. He reports that he does not use drugs.   Physical Exam: BP  (!) 138/91   Pulse 75   Ht 5' 10 (1.778 m)   Wt 200 lb (90.7 kg)   BMI 28.70 kg/m   Constitutional:  Alert, No acute distress. HEENT: [address] AT   Assessment & Plan:    1. Left nephrolithiasis  I think the majority of remaining fragments are small residual particles.  There may be a 3 mm fragment in a midpole calyx. Will repeat his KUB next week and if significant clearance we will schedule stent removal Given Gemtesa samples x 2 weeks    Glendia JAYSON [name], MD  [facility] [address] [place], [place] [address] [phone]

## 2024-02-28 ENCOUNTER — Ambulatory Visit
Admission: RE | Admit: 2024-02-28 | Discharge: 2024-02-28 | Disposition: A | Discharge status: Home / Self Care | Source: Ambulatory Visit | Attending: Urology | Admitting: Urology

## 2024-02-28 ENCOUNTER — Ambulatory Visit
Admission: RE | Admit: 2024-02-28 | Discharge: 2024-02-28 | Disposition: A | Discharge status: Home / Self Care | Attending: Urology | Admitting: Urology

## 2024-02-28 DIAGNOSIS — Z960 Presence of urogenital implants: Secondary | ICD-10-CM | POA: Diagnosis not present

## 2024-02-28 DIAGNOSIS — N200 Calculus of kidney: Secondary | ICD-10-CM | POA: Diagnosis not present

## 2024-02-29 ENCOUNTER — Ambulatory Visit: Payer: Self-pay | Admitting: Urology

## 2024-02-29 ENCOUNTER — Other Ambulatory Visit: Payer: Self-pay | Admitting: Urology

## 2024-02-29 ENCOUNTER — Other Ambulatory Visit: Payer: Self-pay

## 2024-02-29 DIAGNOSIS — N200 Calculus of kidney: Secondary | ICD-10-CM

## 2024-02-29 NOTE — Progress Notes (Signed)
 Surgical Physician Order Form [facility]  Dr. Twylla, MD  * Scheduling expectation : Next Available  *Length of Case: 45 minutes  *Clearance needed: no  *Anticoagulation Instructions: N/A  *Aspirin Instructions: N/A  *Post-op visit Date/Instructions:  1 month follow up  *Diagnosis: Left Nephrolithiasis  *Procedure: left  Ureteroscopy w/laser lithotripsy & stent exchange (47643)   Additional orders: N/A  -Admit type: OUTpatient  -Anesthesia: General  -VTE Prophylaxis Standing Order SCD's       Other:   -Standing Lab Orders Per Anesthesia    Lab other: UA&Urine Culture  -Standing Test orders EKG/Chest x-ray per Anesthesia       Test other:   - Medications:  Ancef  2gm IV  -Other orders:  N/A

## 2024-03-03 ENCOUNTER — Telehealth: Payer: Self-pay

## 2024-03-03 NOTE — Telephone Encounter (Signed)
 Per Dr. Twylla, Patient is to be scheduled for  Left Ureteroscopy with Laser Lithotripsy and Stent Exchange   Mr. Hegeman was contacted and possible surgical dates were discussed, Thursday October 9th, 2025 was agreed upon for surgery.   Patient was instructed that Dr. Twylla will require them to provide a pre-op UA & CX prior to surgery. This was ordered and scheduled drop off appointment was made for 03/04/2024.    Patient was directed to call [phone] between 1-3pm the day before surgery to find out surgical arrival time.  Instructions were given not to eat or drink from midnight on the night before surgery and have a driver for the day of surgery. On the surgery day patient was instructed to enter through the Medical Mall entrance of [facility] report the Same Day Surgery desk.   Pre-Admit Testing will be in contact via phone to set up an interview with the anesthesia team to review your history and medications prior to surgery.   Reminder of this information was sent via MyChart to the patient.

## 2024-03-03 NOTE — Progress Notes (Signed)
   [facility] Posting Form  Surgery Date: Date: 03/20/2024  Surgeon: Dr. Glendia [name], MD  Inpt ( No  )   Outpt (Yes)   Obs ( No  )   Diagnosis: N20.0 Left Nephrolithiasis  -CPT: [phone]  Surgery: Left Ureteroscopy with Laser Lithotripsy and Stent Exchange  Stop Anticoagulations: No, may continue all  Cardiac/Medical/Pulmonary Clearance needed: no  *Orders entered into EPIC  Date: 03/03/24   *Case booked in [place]  Date: 03/03/24  *Notified pt of Surgery: Date: 03/03/24  PRE-OP UA & CX: yes, will obtain in clinic on 03/04/2024  *Placed into Prior Authorization Work [place] Date: 03/03/24  Assistant/laser/rep:No

## 2024-03-04 ENCOUNTER — Other Ambulatory Visit

## 2024-03-04 DIAGNOSIS — N200 Calculus of kidney: Secondary | ICD-10-CM

## 2024-03-04 LAB — MICROSCOPIC EXAMINATION: RBC, Urine: >30 /HPF — AB (ref 0–2)

## 2024-03-04 LAB — URINALYSIS, COMPLETE
Bilirubin, UA: NEGATIVE
Glucose, UA: NEGATIVE
Ketones, UA: NEGATIVE
Nitrite, UA: NEGATIVE
Specific Gravity, UA: 1.025 (ref 1.005–1.030)
Urobilinogen, Ur: 0.2 mg/dL (ref 0.2–1.0)
pH, UA: 6 (ref 5.0–7.5)

## 2024-03-07 LAB — CULTURE, URINE COMPREHENSIVE

## 2024-03-08 ENCOUNTER — Ambulatory Visit: Payer: Self-pay | Admitting: Urology

## 2024-03-10 MED ORDER — SULFAMETHOXAZOLE-TRIMETHOPRIM 800-160 MG PO TABS: 1.0000 | ORAL_TABLET | Freq: Two times a day (BID) | ORAL | 0 refills | Status: AC

## 2024-03-10 NOTE — Telephone Encounter (Signed)
 Spoke with pt. Pt. Advised of results and the need to start antibiotics. Patient will start on 03/16/2024. Medication sent to [facility] pharmacy per patient request.

## 2024-03-13 ENCOUNTER — Other Ambulatory Visit: Payer: Self-pay

## 2024-03-13 ENCOUNTER — Encounter
Admission: RE | Admit: 2024-03-13 | Discharge: 2024-03-13 | Disposition: A | Discharge status: Home / Self Care | Source: Ambulatory Visit | Attending: Urology | Admitting: Urology

## 2024-03-13 HISTORY — DX: Prediabetes: R73.03

## 2024-03-13 NOTE — Patient Instructions (Addendum)
 Your procedure is scheduled on: 03/20/24 - Thursday Report to the Registration Desk on the 1st floor of the Medical Mall. To find out your arrival time, please call [phone] between 1PM - 3PM on: 03/19/24 - Wednesday If your arrival time is 6:00 am, do not arrive before that time as the Medical Mall entrance doors do not open until 6:00 am.  REMEMBER: Instructions that are not followed completely may result in serious medical risk, up to and including death; or upon the discretion of your surgeon and anesthesiologist your surgery may need to be rescheduled.  Do not eat food or drink any liquids after midnight the night before surgery.  No gum chewing or hard candies.  One week prior to surgery beginning on 10/02,  Stop Anti-inflammatories (NSAIDS) such as Advil, Aleve, Ibuprofen, Motrin, Naproxen, Naprosyn and Aspirin based products such as Excedrin, Goody's Powder, BC Powder. You may take Tylenol  if needed for pain up until the day of surgery.  Stop ANY OVER THE COUNTER supplements until after surgery.  You may take Tylenol  if needed for pain up until the day of surgery.  Stop tadalafil (CIALIS) 2 days prior to surgery-Last dose will be on 03/18/24-Monday   Continue taking all of your other prescription medications up until the day of surgery.   ON THE DAY OF SURGERY ONLY TAKE THESE MEDICATIONS WITH SIPS OF WATER :  - buPROPion (WELLBUTRIN XL)  - allopurinol (ZYLOPRIM)  - BACTRIM  DS    No Alcohol for 24 hours before or after surgery.  No Smoking including e-cigarettes for 24 hours before surgery.  No chewable tobacco products for at least 6 hours before surgery.  No nicotine patches on the day of surgery.  Do not use any recreational drugs for at least a week (preferably 2 weeks) before your surgery.  Please be advised that the combination of cocaine and anesthesia may have negative outcomes, up to and including death. If you test positive for cocaine, your surgery will be  cancelled.  On the morning of surgery brush your teeth with toothpaste and water , you may rinse your mouth with mouthwash if you wish. Do not swallow any toothpaste or mouthwash.  Do not wear jewelry, make-up, hairpins, clips or nail polish.  For welded (permanent) jewelry: bracelets, anklets, waist bands, etc.  Please have this removed prior to surgery.  If it is not removed, there is a chance that hospital personnel will need to cut it off on the day of surgery.  Do not wear lotions, powders, or perfumes.   Do not shave body hair from the neck down 48 hours before surgery.  Contact lenses, hearing aids and dentures may not be worn into surgery.  Do not bring valuables to the hospital. [facility] is not responsible for any missing/lost belongings or valuables.   Notify your doctor if there is any change in your medical condition (cold, fever, infection).  Wear comfortable clothing (specific to your surgery type) to the hospital.  After surgery, you can help prevent lung complications by doing breathing exercises.  Take deep breaths and cough every 1-2 hours. Your doctor may order a device called an Incentive Spirometer to help you take deep breaths.  When coughing or sneezing, hold a pillow firmly against your incision with both hands. This is called "splinting." Doing this helps protect your incision. It also decreases belly discomfort.  If you are being admitted to the hospital overnight, leave your suitcase in the car. After surgery it may be brought  to your room.  In case of increased patient census, it may be necessary for you, the patient, to continue your postoperative care in the Same Day Surgery department.  If you are being discharged the day of surgery, you will not be allowed to drive home. You will need a responsible individual to drive you home and stay with you for 24 hours after surgery.   If you are taking public transportation, you will need to have a responsible  individual with you.  Please call the Pre-admissions Testing Dept. at [phone] if you have any questions about these instructions.  Surgery Visitation Policy:  Patients having surgery or a procedure may have two visitors.  Children under the age of 1 must have an adult with them who is not the patient.  Inpatient Visitation:    Visiting hours are 7 a.m. to 8 p.m. Up to four visitors are allowed at one time in a patient room. The visitors may rotate out with other people during the day.  One visitor age 60 or older may stay with the patient overnight and must be in the room by 8 p.m.   Merchandiser, retail to address health-related social needs:  [email or web address]

## 2024-03-19 MED ORDER — ORAL CARE MOUTH RINSE: 15.0000 mL | Freq: Once | OROMUCOSAL | Status: AC

## 2024-03-19 MED ORDER — LACTATED RINGERS IV SOLN: INTRAVENOUS | Status: DC

## 2024-03-19 MED ORDER — CHLORHEXIDINE GLUCONATE 0.12 % MT SOLN
15.0000 mL | Freq: Once | OROMUCOSAL | Status: AC
  Administered 2024-03-20: 15 mL via OROMUCOSAL

## 2024-03-19 MED ORDER — CIPROFLOXACIN IN D5W 400 MG/200ML IV SOLN
400.0000 mg | Freq: Once | INTRAVENOUS | Status: AC
  Administered 2024-03-20: 400 mg via INTRAVENOUS

## 2024-03-20 ENCOUNTER — Ambulatory Visit
Admission: RE | Admit: 2024-03-20 | Discharge: 2024-03-20 | Disposition: A | Discharge status: Home / Self Care | Attending: Urology | Admitting: Urology

## 2024-03-20 ENCOUNTER — Other Ambulatory Visit: Payer: Self-pay

## 2024-03-20 ENCOUNTER — Encounter: Admission: RE | Disposition: A | Payer: Self-pay | Source: Home / Self Care | Attending: Urology

## 2024-03-20 ENCOUNTER — Ambulatory Visit

## 2024-03-20 ENCOUNTER — Ambulatory Visit: Payer: Self-pay | Admitting: Urgent Care

## 2024-03-20 ENCOUNTER — Encounter: Payer: Self-pay | Admitting: Urology

## 2024-03-20 ENCOUNTER — Ambulatory Visit: Admitting: Anesthesiology

## 2024-03-20 DIAGNOSIS — N200 Calculus of kidney: Secondary | ICD-10-CM | POA: Diagnosis not present

## 2024-03-20 DIAGNOSIS — F32A Depression, unspecified: Secondary | ICD-10-CM | POA: Diagnosis not present

## 2024-03-20 DIAGNOSIS — K219 Gastro-esophageal reflux disease without esophagitis: Secondary | ICD-10-CM | POA: Diagnosis not present

## 2024-03-20 DIAGNOSIS — I10 Essential (primary) hypertension: Secondary | ICD-10-CM | POA: Insufficient documentation

## 2024-03-20 HISTORY — PX: CYSTOSCOPY/URETEROSCOPY/HOLMIUM LASER/STENT PLACEMENT: SHX6546

## 2024-03-20 SURGERY — CYSTOSCOPY/URETEROSCOPY/HOLMIUM LASER/STENT PLACEMENT
Anesthesia: General | Site: Ureter | Laterality: Left

## 2024-03-20 MED ORDER — DROPERIDOL 2.5 MG/ML IJ SOLN: 0.6250 mg | Freq: Once | INTRAMUSCULAR | Status: DC | PRN

## 2024-03-20 MED ORDER — CIPROFLOXACIN IN D5W 400 MG/200ML IV SOLN
INTRAVENOUS | Status: AC
  Filled 2024-03-20: qty 200

## 2024-03-20 MED ORDER — ACETAMINOPHEN 10 MG/ML IV SOLN: 1000.0000 mg | Freq: Once | INTRAVENOUS | Status: DC | PRN

## 2024-03-20 MED ORDER — MIDAZOLAM HCL 2 MG/2ML IJ SOLN
INTRAMUSCULAR | Status: DC | PRN
  Administered 2024-03-20: 2 mg via INTRAVENOUS

## 2024-03-20 MED ORDER — PROPOFOL 10 MG/ML IV BOLUS
INTRAVENOUS | Status: AC
Start: 2024-03-20 — End: 2024-03-20
  Filled 2024-03-20: qty 20

## 2024-03-20 MED ORDER — PROPOFOL 1000 MG/100ML IV EMUL
INTRAVENOUS | Status: AC
  Filled 2024-03-20: qty 100

## 2024-03-20 MED ORDER — HYDROCODONE-ACETAMINOPHEN 5-325 MG PO TABS: 1.0000 | ORAL_TABLET | Freq: Four times a day (QID) | ORAL | 0 refills | Status: AC | PRN

## 2024-03-20 MED ORDER — IOHEXOL 180 MG/ML  SOLN
INTRAMUSCULAR | Status: DC | PRN
  Administered 2024-03-20 (×2): 10 mL

## 2024-03-20 MED ORDER — LIDOCAINE HCL (PF) 2 % IJ SOLN
INTRAMUSCULAR | Status: AC
Start: 2024-03-20 — End: 2024-03-20
  Filled 2024-03-20: qty 5

## 2024-03-20 MED ORDER — ROCURONIUM BROMIDE 100 MG/10ML IV SOLN
INTRAVENOUS | Status: DC | PRN
  Administered 2024-03-20: 50 mg via INTRAVENOUS

## 2024-03-20 MED ORDER — FENTANYL CITRATE (PF) 100 MCG/2ML IJ SOLN
INTRAMUSCULAR | Status: DC | PRN
  Administered 2024-03-20 (×2): 50 ug via INTRAVENOUS

## 2024-03-20 MED ORDER — FENTANYL CITRATE (PF) 100 MCG/2ML IJ SOLN: 25.0000 ug | INTRAMUSCULAR | Status: DC | PRN

## 2024-03-20 MED ORDER — SUGAMMADEX SODIUM 200 MG/2ML IV SOLN
INTRAVENOUS | Status: DC | PRN
  Administered 2024-03-20: 185 mg via INTRAVENOUS

## 2024-03-20 MED ORDER — DEXAMETHASONE SODIUM PHOSPHATE 10 MG/ML IJ SOLN
INTRAMUSCULAR | Status: DC | PRN
  Administered 2024-03-20: 10 mg via INTRAVENOUS

## 2024-03-20 MED ORDER — PHENYLEPHRINE 80 MCG/ML (10ML) SYRINGE FOR IV PUSH (FOR BLOOD PRESSURE SUPPORT)
PREFILLED_SYRINGE | INTRAVENOUS | Status: DC | PRN
  Administered 2024-03-20: 160 ug via INTRAVENOUS

## 2024-03-20 MED ORDER — SODIUM CHLORIDE 0.9 % IR SOLN
Status: DC | PRN
  Administered 2024-03-20: 3000 mL

## 2024-03-20 MED ORDER — LIDOCAINE HCL (CARDIAC) PF 100 MG/5ML IV SOSY
PREFILLED_SYRINGE | INTRAVENOUS | Status: DC | PRN
  Administered 2024-03-20: 100 mg via INTRAVENOUS

## 2024-03-20 MED ORDER — ACETAMINOPHEN 10 MG/ML IV SOLN
INTRAVENOUS | Status: AC
  Filled 2024-03-20: qty 100

## 2024-03-20 MED ORDER — OXYCODONE HCL 5 MG PO TABS: 5.0000 mg | ORAL_TABLET | Freq: Once | ORAL | Status: DC | PRN

## 2024-03-20 MED ORDER — ACETAMINOPHEN 10 MG/ML IV SOLN
INTRAVENOUS | Status: DC | PRN
  Administered 2024-03-20: 1000 mg via INTRAVENOUS

## 2024-03-20 MED ORDER — OXYCODONE HCL 5 MG/5ML PO SOLN: 5.0000 mg | Freq: Once | ORAL | Status: DC | PRN

## 2024-03-20 MED ORDER — MIDAZOLAM HCL 2 MG/2ML IJ SOLN
INTRAMUSCULAR | Status: AC
  Filled 2024-03-20: qty 2

## 2024-03-20 MED ORDER — CHLORHEXIDINE GLUCONATE 0.12 % MT SOLN
OROMUCOSAL | Status: AC
  Filled 2024-03-20: qty 15

## 2024-03-20 MED ORDER — FENTANYL CITRATE (PF) 100 MCG/2ML IJ SOLN
INTRAMUSCULAR | Status: AC
  Filled 2024-03-20: qty 2

## 2024-03-20 MED ORDER — ROCURONIUM BROMIDE 10 MG/ML (PF) SYRINGE
PREFILLED_SYRINGE | INTRAVENOUS | Status: AC
  Filled 2024-03-20: qty 10

## 2024-03-20 MED ORDER — STERILE WATER FOR IRRIGATION IR SOLN
Status: DC | PRN
  Administered 2024-03-20: 500 mL

## 2024-03-20 MED ORDER — ONDANSETRON HCL 4 MG/2ML IJ SOLN
INTRAMUSCULAR | Status: DC | PRN
  Administered 2024-03-20: 4 mg via INTRAVENOUS

## 2024-03-20 MED ORDER — PROPOFOL 10 MG/ML IV BOLUS
INTRAVENOUS | Status: DC | PRN
Start: 2024-03-20 — End: 2024-03-20
  Administered 2024-03-20: 120 mg via INTRAVENOUS
  Administered 2024-03-20: 120 ug/kg/min via INTRAVENOUS

## 2024-03-20 MED ORDER — ONDANSETRON HCL 4 MG/2ML IJ SOLN
INTRAMUSCULAR | Status: AC
  Filled 2024-03-20: qty 2

## 2024-03-20 SURGICAL SUPPLY — 22 items
BAG DRAIN SIEMENS DORNER NS (MISCELLANEOUS) ×1 IMPLANT
BASKET ZERO TIP 1.9FR (BASKET) IMPLANT
BRUSH SCRUB EZ 4% CHG (MISCELLANEOUS) ×1 IMPLANT
CATH URET FLEX-TIP 2 LUMEN 10F (CATHETERS) IMPLANT
CATH URETL OPEN END 6X70 (CATHETERS) IMPLANT
DRAPE UTILITY 15X26 TOWEL STRL (DRAPES) ×1 IMPLANT
FIBER LASER MOSES 200 DFL (Laser) IMPLANT
FIBER LASER MOSES 365 DFL (Laser) IMPLANT
GLOVE BIOGEL PI IND STRL 7.5 (GLOVE) ×1 IMPLANT
GOWN STRL REUS W/ TWL LRG LVL3 (GOWN DISPOSABLE) ×1 IMPLANT
GOWN STRL REUS W/ TWL XL LVL3 (GOWN DISPOSABLE) ×1 IMPLANT
GUIDEWIRE STR DUAL SENSOR (WIRE) ×1 IMPLANT
KIT TURNOVER CYSTO (KITS) ×1 IMPLANT
PACK CYSTO AR (MISCELLANEOUS) ×1 IMPLANT
SET CYSTO IRRIGATION (SET/KITS/TRAYS/PACK) ×1 IMPLANT
SHEATH NAVIGATOR HD 12/14X36 (SHEATH) IMPLANT
SOL .9 NS 3000ML IRR UROMATIC (IV SOLUTION) ×1 IMPLANT
STENT URET 6FRX24 CONTOUR (STENTS) IMPLANT
STENT URET 6FRX26 CONTOUR (STENTS) IMPLANT
SURGILUBE 2OZ TUBE FLIPTOP (MISCELLANEOUS) ×1 IMPLANT
VALVE UROSEAL ADJ ENDO (VALVE) IMPLANT
WATER STERILE IRR 500ML POUR (IV SOLUTION) ×1 IMPLANT

## 2024-03-20 NOTE — Anesthesia Procedure Notes (Signed)
 Procedure Name: Intubation Date/Time: 03/20/2024 10:59 AM  Performed by: Jackye Spanner, CRNAPre-anesthesia Checklist: Patient identified, Patient being monitored, Timeout performed, Emergency Drugs available and Suction available Patient Re-evaluated:Patient Re-evaluated prior to induction Oxygen Delivery Method: Circle system utilized Preoxygenation: Pre-oxygenation with 100% oxygen Induction Type: IV induction Ventilation: Mask ventilation without difficulty Laryngoscope Size: 3 and McGrath Grade View: Grade I Tube type: Oral Tube size: 7.0 mm Number of attempts: 1 Airway Equipment and Method: Stylet Placement Confirmation: ETT inserted through vocal cords under direct vision, positive ETCO2 and breath sounds checked- equal and bilateral Secured at: 22 cm Tube secured with: Tape Dental Injury: Teeth and Oropharynx as per pre-operative assessment  Comments: Smooth atraumatic intubation, no complications noted.

## 2024-03-20 NOTE — Anesthesia Preprocedure Evaluation (Signed)
 Anesthesia Evaluation  Patient identified by MRN, date of birth, ID band Patient awake    Reviewed: Allergy & Precautions, H&P , NPO status , Patient's Chart, lab work & pertinent test results, reviewed documented beta blocker date and time   History of Anesthesia Complications (+) PONV and history of anesthetic complications  Airway Mallampati: II   Neck ROM: full    Dental  (+) Teeth Intact   Pulmonary neg pulmonary ROS   Pulmonary exam normal        Cardiovascular Exercise Tolerance: Good hypertension, On Medications negative cardio ROS Normal cardiovascular exam Rhythm:regular Rate:Normal     Neuro/Psych  PSYCHIATRIC DISORDERS  Depression    negative neurological ROS  negative psych ROS   GI/Hepatic negative GI ROS, Neg liver ROS,GERD  ,,  Endo/Other  negative endocrine ROS    Renal/GU      Musculoskeletal   Abdominal Normal abdominal exam  (+)   Peds  Hematology negative hematology ROS (+)   Anesthesia Other Findings Past Medical History: No date: Complication of anesthesia No date: Depression No date: Family history of adverse reaction to anesthesia     Comment:  mom nausea No date: GERD (gastroesophageal reflux disease) No date: Gout No date: History of kidney stones No date: Hypertension No date: Insomnia No date: PONV (postoperative nausea and vomiting)     Comment:  nausea Past Surgical History: No date: COLONOSCOPY No date: KIDNEY STONE SURGERY No date: TONSILLECTOMY No date: VASECTOMY BMI    Body Mass Index:  28.41 kg/m     Reproductive/Obstetrics negative OB ROS                              Anesthesia Physical Anesthesia Plan  ASA: 2  Anesthesia Plan: General ETT   Post-op Pain Management: Toradol IV (intra-op)* and Ofirmev  IV (intra-op)*   Induction: Intravenous  PONV Risk Score and Plan: 4 or greater and Ondansetron , Dexamethasone , Midazolam ,  Propofol  infusion and TIVA  Airway Management Planned: Oral ETT  Additional Equipment:   Intra-op Plan:   Post-operative Plan: Extubation in OR  Informed Consent: I have reviewed the patients History and Physical, chart, labs and discussed the procedure including the risks, benefits and alternatives for the proposed anesthesia with the patient or authorized representative who has indicated his/her understanding and acceptance.     Dental Advisory Given  Plan Discussed with: Anesthesiologist, CRNA and Surgeon  Anesthesia Plan Comments: (Patient consented for risks of anesthesia including but not limited to:  - adverse reactions to medications - damage to eyes, teeth, lips or other oral mucosa - nerve damage due to positioning  - sore throat or hoarseness - Damage to heart, brain, nerves, lungs, other parts of body or loss of life  Patient voiced understanding and assent.)         Anesthesia Quick Evaluation

## 2024-03-20 NOTE — Transfer of Care (Signed)
 Immediate Anesthesia Transfer of Care Note  Patient: Manuel Bruce  Procedure(s) Performed: CYSTOSCOPY/URETEROSCOPY/HOLMIUM LASER/STENT PLACEMENT (Left: Ureter)  Patient Location: PACU  Anesthesia Type:General  Level of Consciousness: awake, alert , and oriented  Airway & Oxygen Therapy: Patient Spontanous Breathing and Patient connected to face mask oxygen  Post-op Assessment: Report given to RN and Post -op Vital signs reviewed and stable  Post vital signs: Reviewed and stable  Last Vitals:  Vitals Value Taken Time  BP 142/77 03/20/24 12:10  Temp 35.9 1210  Pulse 75 03/20/24 12:11  Resp 22 03/20/24 12:11  SpO2 94 % 03/20/24 12:11  Vitals shown include unfiled device data.  Last Pain:  Vitals:   03/20/24 0939  TempSrc: Temporal  PainSc: 0-No pain         Complications: No notable events documented.

## 2024-03-20 NOTE — Discharge Instructions (Addendum)
 DISCHARGE INSTRUCTIONS FOR KIDNEY STONE/URETERAL STENT   MEDICATIONS:  1. Resume all your other meds from home.  2.  AZO (over-the-counter) can help with the burning/stinging when you urinate. 3.  Hydrocodone  is for moderate/severe pain, Rx was sent to your pharmacy.  ACTIVITY:  1. May resume regular activities in 24 hours. 2. No driving while on narcotic pain medications  3. Drink plenty of water   4. Continue to walk at home - you can still get blood clots when you are at home, so keep active, but don't over do it.  5. May return to work/school tomorrow or when you feel ready   BATHING:  1. You can shower. 2. You have a string coming from your urethra: The stent string is attached to your ureteral stent. Do not pull on this.   SIGNS/SYMPTOMS TO CALL:  Common postoperative symptoms include urinary frequency, urgency, bladder spasm and blood in the urine  Please call us  if you have a fever greater than 101.5, uncontrolled nausea/vomiting, uncontrolled pain, dizziness, unable to urinate, excessively bloody urine, chest pain, shortness of breath, leg swelling, leg pain, or any other concerns or questions.   You can reach us  at [phone].   FOLLOW-UP:  1. You will be contacted for postop follow-up appointment 2. You have a string attached to your stent, you may remove it on Friday 03/21/2024. To do this, pull the string until the stent is completely removed. You may feel an odd sensation in your back.

## 2024-03-20 NOTE — Interval H&P Note (Signed)
 History and Physical Interval Note:  03/20/2024 10:33 AM  Manuel Bruce  has presented today for surgery, with the diagnosis of Left Nephrolithiasis.  The various methods of treatment have been discussed with the patient and family. After consideration of risks, benefits and other options for treatment, the patient has consented to  Procedure(s) with comments: CYSTOSCOPY/URETEROSCOPY/HOLMIUM LASER/STENT PLACEMENT (Left) - EXCHANGE as a surgical intervention.  The patient's history has been reviewed, patient examined, no change in status, stable for surgery.  I have reviewed the patient's chart and labs.  Questions were answered to the patient's satisfaction.    Status post left ureteroscopy with laser lithotripsy of 2 cm left renal pelvic calculus 01/31/2024.  Some residual fragments on KUB.  Presents for second look ureteroscopy with possible laser lithotripsy/stone removal  CV: RRR Lungs: Clear   Baer Hinton C Mikaila Grunert

## 2024-03-20 NOTE — Op Note (Signed)
 Preoperative diagnosis:  Left nephrolithiasis   Postoperative diagnosis:  Left nephrolithiasis  Procedure:  Cystoscopy Left ureteroscopy and stone removal Ureteroscopic laser lithotripsy Left ureteral stent exchange (62F/26 cm)  Left retrograde pyelography with interpretation  Surgeon: Glendia C. Courtney Bellizzi, M.D.  Anesthesia: General  Complications: None  Intraoperative findings:  Cystoscopy: Urethra normal in caliber without stricture.  Prostate with prominent lateral lobe enlargement and mild bladder neck elevation.  Bladder mucosa without solid or papillary lesions. Inflammatory changes left UO secondary to indwelling stent. Ureteropyeloscopy: Small upper calyceal submucosal calculus; lower calyceal fragments into estimated 3 mm; several smaller fragments estimated 2 mm Left retrograde pyelography post procedure showed no filling defects, stone fragments or contrast extravasation  EBL: Minimal  Specimens: None   Indication: Manuel Bruce is a 63 y.o. male status post left ureteroscopy with laser lithotripsy of a 10 x 20 mm left renal pelvic calculus on 01/31/2024.  Follow-up KUB with some residual fragments >1 mm in size.  Presents for follow-up second look ureteroscopy.  Refer to the admission H&P for details.  After reviewing the management options for treatment, the patient elected to proceed with the above surgical procedure(s). We have discussed the potential benefits and risks of the procedure, side effects of the proposed treatment, the likelihood of the patient achieving the goals of the procedure, and any potential problems that might occur during the procedure or recuperation. Informed consent has been obtained.  Description of procedure:  The patient was taken to the operating room and general anesthesia was induced.  The patient was placed in the dorsal lithotomy position, prepped and draped in the usual sterile fashion, and preoperative antibiotics were administered.  A preoperative time-out was performed.   A 21 Fr cystoscope was lubricated, placed per urethra and advanced proximally into the bladder under direct vision.  Panendoscopy was performed with findings described above.    The indwelling stent was grasped with endoscopic forceps and brought out through the urethral meatus.  The stent was cannulated with a 0.038 Sensor wire which was advanced into the renal pelvis under fluoroscopic guidance.  A single channel digital flexible ureteroscope was placed per urethra and advanced into the bladder under direct vision.  The ureteroscope was easily advanced into the left ureter alongside the guidewire and advanced into the renal pelvis.  No ureteral fragments were identified.  Contrast was instilled through the ureteroscope and all calyces were examined under fluoroscopic guidance with findings as described above.  2 of the largest lower calyceal fragments were placed and a 1.9 F0 tip nitinol basket and removed without difficulty.  The grouping of lower calyceal calculi was then dusted using noncontact laser lithotripsy with a 365 m holmium Moses fiber at a setting of 0.5 J/80 Hz until no fragments were identified that were larger than the tip of the laser fiber.  Contrast was again instilled through the ureteroscope and all calyces were again examined and fluoroscopic guidance and no significantly sized fragments were seen.  The ureteroscope was removed and a 62F/26 cm Contour ureteral stent with tether was placed under fluoroscopic guidance.  The wire was then removed with an adequate stent curl noted in the upper pole calyx as well as in the bladder.  The bladder was then emptied and the procedure ended.  The patient appeared to tolerate the procedure well and without complications.  After anesthetic reversal the patient was transported to the PACU in stable condition.    Plan: He was instructed to remove his stent  03/21/2024 Postop follow-up with KUB ~1  month   Glendia [name], MD

## 2024-03-20 NOTE — Anesthesia Postprocedure Evaluation (Signed)
 Anesthesia Post Note  Patient: Manuel Bruce  Procedure(s) Performed: CYSTOSCOPY/URETEROSCOPY/HOLMIUM LASER/STENT PLACEMENT (Left: Ureter)  Patient location during evaluation: PACU Anesthesia Type: General Level of consciousness: awake and alert Pain management: pain level controlled Vital Signs Assessment: post-procedure vital signs reviewed and stable Respiratory status: spontaneous breathing, nonlabored ventilation and respiratory function stable Cardiovascular status: blood pressure returned to baseline and stable Postop Assessment: no apparent nausea or vomiting Anesthetic complications: no   No notable events documented.   Last Vitals:  Vitals:   03/20/24 1230 03/20/24 1251  BP: 117/86 (!) 138/98  Pulse: 70 72  Resp: 16 16  Temp: 36.5 C (!) 36.3 C  SpO2: 94% 96%    Last Pain:  Vitals:   03/20/24 1251  TempSrc: Temporal  PainSc: 0-No pain                 Camellia Merilee Louder

## 2024-03-21 ENCOUNTER — Encounter: Payer: Self-pay | Admitting: Urology

## 2024-03-24 ENCOUNTER — Encounter: Payer: Self-pay | Admitting: Urology

## 2024-04-21 ENCOUNTER — Ambulatory Visit
Admission: RE | Admit: 2024-04-21 | Discharge: 2024-04-21 | Disposition: A | Discharge status: Home / Self Care | Source: Ambulatory Visit | Attending: Urology | Admitting: Urology

## 2024-04-21 ENCOUNTER — Ambulatory Visit
Admission: RE | Admit: 2024-04-21 | Discharge: 2024-04-21 | Disposition: A | Discharge status: Home / Self Care | Attending: Urology | Admitting: Urology

## 2024-04-21 ENCOUNTER — Encounter: Payer: Self-pay | Admitting: Urology

## 2024-04-21 ENCOUNTER — Ambulatory Visit (INDEPENDENT_AMBULATORY_CARE_PROVIDER_SITE_OTHER): Admitting: Urology

## 2024-04-21 VITALS — BP 161/92 | HR 95 | Ht 70.0 in | Wt 202.0 lb

## 2024-04-21 DIAGNOSIS — N200 Calculus of kidney: Secondary | ICD-10-CM | POA: Diagnosis not present

## 2024-04-21 DIAGNOSIS — N401 Enlarged prostate with lower urinary tract symptoms: Secondary | ICD-10-CM

## 2024-04-21 DIAGNOSIS — R972 Elevated prostate specific antigen [PSA]: Secondary | ICD-10-CM

## 2024-04-21 MED ORDER — ALFUZOSIN HCL ER 10 MG PO TB24: 10.0000 mg | ORAL_TABLET | Freq: Every day | ORAL | 3 refills | Status: AC

## 2024-04-21 NOTE — Patient Instructions (Signed)

## 2024-04-21 NOTE — Progress Notes (Signed)
 04/21/2024 9:24 AM   Manuel Bruce 06-26-1960 [id number]  Referring provider: Valora Lynwood FALCON, MD [address] [facility] [place],  [place] [address]  Chief Complaint  Patient presents with   Nephrolithiasis    HPI: Manuel Bruce is a 63 y.o. male presents for postop follow-up.  Status post left ureteroscopy laser lithotripsy of a 10 x 20 mm left renal pelvic calculus 01/31/2024. Second look ureteroscopy 03/20/2024 with upper calyceal submucosal calculi and 2-3 mm lower calyceal fragments which were basketed/lasered.  Stent was removed 24 hours postop and he has had no problems. Stone analysis 80% calcium oxalate monohydrate/20% uric acid Has continued alfuzosin  which has significantly improved his nocturia Last PSA performed by PCP was 05/22/2022   PMH: Past Medical History:  Diagnosis Date   Bladder cancer (HCC)    Complication of anesthesia    Depression    ED (erectile dysfunction)    Elevated PSA    Enlarged prostate    Family history of adverse reaction to anesthesia    mom nausea   GERD (gastroesophageal reflux disease)    Gout    History of kidney stones    Hypertension    Insomnia    Left nephrolithiasis 2025   PONV (postoperative nausea and vomiting)    nausea   Pre-diabetes     Surgical History: Past Surgical History:  Procedure Laterality Date   COLONOSCOPY     COLONOSCOPY WITH PROPOFOL  N/A 10/12/2017   Procedure: COLONOSCOPY WITH PROPOFOL ;  Surgeon: Gaylyn Gladis PENNER, MD;  Location: [facility] ENDOSCOPY;  Service: Endoscopy;  Laterality: N/A;   CYSTOSCOPY W/ RETROGRADES Left 01/31/2024   Procedure: CYSTOSCOPY, WITH RETROGRADE PYELOGRAM;  Surgeon: Twylla Glendia BROCKS, MD;  Location: [facility] ORS;  Service: Urology;  Laterality: Left;   CYSTOSCOPY/URETEROSCOPY/HOLMIUM LASER/STENT PLACEMENT Left 01/31/2024   Procedure: CYSTOSCOPY/URETEROSCOPY/HOLMIUM LASER/STENT PLACEMENT;  Surgeon: Twylla Glendia BROCKS, MD;  Location: [facility] OR[facility];  Service: Urology;   Laterality: Left;   CYSTOSCOPY/URETEROSCOPY/HOLMIUM LASER/STENT PLACEMENT Left 03/20/2024   Procedure: CYSTOSCOPY/URETEROSCOPY/HOLMIUM LASER/STENT PLACEMENT;  Surgeon: Twylla Glendia BROCKS, MD;  Location: [facility] OR[facility];  Service: Urology;  Laterality: Left;  EXCHANGE   KIDNEY STONE SURGERY     KNEE ARTHROSCOPY Bilateral    NASAL SINUS SURGERY     TONSILLECTOMY     VASECTOMY      Home Medications:  Allergies as of 04/21/2024       Reactions   Codeine Nausea And Vomiting        Medication List        Accurate as of April 21, 2024  9:24 AM. If you have any questions, ask your nurse or doctor.          STOP taking these medications    diphenhydrAMINE 50 MG capsule Commonly known as: BENADRYL Stopped by: Glendia BROCKS Twylla       TAKE these medications    alfuzosin  10 MG 24 hr tablet Commonly known as: UROXATRAL  Take 1 tablet (10 mg total) by mouth daily with breakfast.   allopurinol 100 MG tablet Commonly known as: ZYLOPRIM Take 200 mg by mouth in the morning.   aluminum chloride 20 % external solution Commonly known as: DRYSOL Apply 20 % topically daily as needed (hyperhidrosis).   buPROPion 150 MG 24 hr tablet Commonly known as: WELLBUTRIN XL Take 150 mg by mouth every morning.   HYDROcodone -acetaminophen  5-325 MG tablet Commonly known as: NORCO/VICODIN Take 1 tablet by mouth every 6 (six) hours as needed for moderate pain (pain score  4-6).   ibuprofen 200 MG tablet Commonly known as: ADVIL Take 400 mg by mouth every 8 (eight) hours as needed (pain.).   lisinopril 40 MG tablet Commonly known as: ZESTRIL Take 40 mg by mouth every morning.   omeprazole 40 MG capsule Commonly known as: PRILOSEC Take 40 mg by mouth at bedtime.   sulfamethoxazole -trimethoprim  800-160 MG tablet Commonly known as: BACTRIM  DS Take 1 tablet by mouth every 12 (twelve) hours.   tadalafil 20 MG tablet Commonly known as: CIALIS Take 20 mg by mouth daily as needed for erectile  dysfunction.   zolpidem 10 MG tablet Commonly known as: AMBIEN Take 10 mg by mouth at bedtime.        Allergies:  Allergies  Allergen Reactions   Codeine Nausea And Vomiting    Family History: Family History  Problem Relation Age of Onset   Heart disease Mother    Heart disease Father     Social History:  reports that he has never smoked. He has never used smokeless tobacco. He reports current alcohol use of about 6.0 - 8.0 standard drinks of alcohol per week. He reports that he does not use drugs.   Physical Exam: BP (!) 161/92   Pulse 95   Ht 5' 10 (1.778 m)   Wt 202 lb (91.6 kg)   BMI 28.98 kg/m   Constitutional:  Alert, No acute distress. HEENT: [place] AT Respiratory: Normal respiratory effort, no increased work of breathing. Psychiatric: Normal mood and affect.   Pertinent Imaging: KUB performed prior to today's office visit does show a calcification overlying the lateral portion of the left renal outline which may represent the upper calyceal submucosal calculi   Assessment & Plan:    1.  Recurrent nephrolithiasis We discussed a metabolic evaluation and he is interested in pursuing Litholink order placed and will notify with results Follow-up KUB 6 months  2.  BPH with LUTS Alfuzosin  refilled  3.  Elevated PSA Last PSA was December 2023 Patient states he is scheduled for annual physical with his PCP in the near future and will have PSA drawn at that time   Glendia JAYSON [name], MD  [facility] [address] [place], [place] [address] [phone]

## 2024-05-02 ENCOUNTER — Other Ambulatory Visit

## 2024-05-02 DIAGNOSIS — N200 Calculus of kidney: Secondary | ICD-10-CM

## 2024-05-03 LAB — LITHOLINK SERUM PANEL
CO2: 19 mmol/L — ABNORMAL LOW (ref 20–29)
Calcium: 9.9 mg/dL (ref 8.6–10.2)
Chloride: 107 mmol/L — ABNORMAL HIGH (ref 96–106)
Creatinine, Ser: 1.37 mg/dL — ABNORMAL HIGH (ref 0.76–1.27)
Magnesium: 1.7 mg/dL (ref 1.6–2.3)
Phosphorus: 3 mg/dL (ref 2.8–4.1)
Potassium: 4.3 mmol/L (ref 3.5–5.2)
Sodium: 142 mmol/L (ref 134–144)
Uric Acid: 5 mg/dL (ref 3.8–8.4)
eGFR: 58 mL/min/1.73 — ABNORMAL LOW (ref >59)

## 2024-05-07 ENCOUNTER — Ambulatory Visit: Payer: Self-pay | Admitting: Urology

## 2024-05-07 LAB — LITHOLINK 24HR URINE PANEL
Ammonium, Urine: 35 mmol/(24.h) (ref 15–60)
Calcium Oxalate Saturation: 3.15 — ABNORMAL LOW (ref 6.00–10.00)
Calcium Phosphate Saturation: 0.18 — ABNORMAL LOW (ref 0.50–2.00)
Calcium, Urine: 397 mg/(24.h) — ABNORMAL HIGH (ref <250)
Calcium/Creatinine Ratio: 190 mg/g{creat} (ref 34–196)
Calcium/Kg Body Weight: 4.4 mg/kg/d — ABNORMAL HIGH (ref <4.0)
Chloride, Urine: 258 mmol/(24.h) — ABNORMAL HIGH (ref 70–250)
Citrate, Urine: 688 mg/(24.h) (ref >450)
Creatinine, Urine: 2090 mg/(24.h)
Creatinine/Kg Body Weight: 23 mg/kg/d (ref 11.9–24.4)
Cystine, Urine, Qualitative: NEGATIVE
Magnesium, Urine: 136 mg/(24.h) — ABNORMAL HIGH (ref 30–120)
Oxalate, Urine: 23 mg/(24.h) (ref 20–40)
Phosphorus, Urine: 908 mg/(24.h) (ref 600–1200)
Potassium, Urine: 77 mmol/(24.h) (ref 20–100)
Protein Catabolic Rate: 0.9 g/kg/d (ref 0.8–1.4)
Sodium, Urine: 230 mmol/(24.h) — ABNORMAL HIGH (ref 50–150)
Sulfate, Urine: 42 meq/(24.h) (ref 20–80)
Urea Nitrogen, Urine: 10.23 g/(24.h) (ref 6.00–14.00)
Uric Acid Saturation: 0.67 (ref <1.00)
Uric Acid, Urine: 385 mg/(24.h) (ref <800)
Urine Volume (Preserved): 3310 mL/(24.h) (ref 500–4000)
pH, 24 hr, Urine: 5.32 — ABNORMAL LOW (ref 5.800–6.200)

## 2024-06-02 DIAGNOSIS — M47817 Spondylosis without myelopathy or radiculopathy, lumbosacral region: Secondary | ICD-10-CM | POA: Diagnosis not present

## 2024-06-02 DIAGNOSIS — M5137 Other intervertebral disc degeneration, lumbosacral region with discogenic back pain only: Secondary | ICD-10-CM | POA: Diagnosis not present

## 2024-06-02 DIAGNOSIS — M5134 Other intervertebral disc degeneration, thoracic region: Secondary | ICD-10-CM | POA: Diagnosis not present

## 2024-06-02 DIAGNOSIS — M4312 Spondylolisthesis, cervical region: Secondary | ICD-10-CM | POA: Diagnosis not present

## 2024-06-02 DIAGNOSIS — M5031 Other cervical disc degeneration,  high cervical region: Secondary | ICD-10-CM | POA: Diagnosis not present

## 2024-07-08 ENCOUNTER — Encounter: Payer: Self-pay | Admitting: Urology

## 2024-10-20 ENCOUNTER — Ambulatory Visit: Admitting: Urology
